# Patient Record
Sex: Female | Born: 1960 | Race: Black or African American | Hispanic: No | Marital: Married | Smoking: Current every day smoker
Health system: Southern US, Community
[De-identification: ages and names within clinical notes are randomized; demographics above are authoritative.]

## PROBLEM LIST (undated history)

## (undated) DIAGNOSIS — I10 Essential (primary) hypertension: Secondary | ICD-10-CM

## (undated) HISTORY — PX: KNEE ARTHROSCOPY: SUR90

---

## 1998-06-27 ENCOUNTER — Ambulatory Visit (HOSPITAL_COMMUNITY): Admission: RE | Admit: 1998-06-27 | Discharge: 1998-06-27 | Payer: Self-pay | Admitting: Obstetrics and Gynecology

## 1998-07-04 ENCOUNTER — Encounter (HOSPITAL_COMMUNITY): Admission: RE | Admit: 1998-07-04 | Discharge: 1998-07-28 | Payer: Self-pay | Admitting: Obstetrics and Gynecology

## 1998-07-27 ENCOUNTER — Inpatient Hospital Stay (HOSPITAL_COMMUNITY): Admission: AD | Admit: 1998-07-27 | Discharge: 1998-07-30 | Payer: Self-pay | Admitting: Obstetrics and Gynecology

## 2004-05-13 ENCOUNTER — Emergency Department (HOSPITAL_COMMUNITY): Admission: EM | Admit: 2004-05-13 | Discharge: 2004-05-13 | Payer: Self-pay | Admitting: Emergency Medicine

## 2004-05-31 ENCOUNTER — Ambulatory Visit (HOSPITAL_COMMUNITY): Admission: RE | Admit: 2004-05-31 | Discharge: 2004-05-31 | Payer: Self-pay | Admitting: Orthopaedic Surgery

## 2004-06-26 ENCOUNTER — Ambulatory Visit (HOSPITAL_COMMUNITY): Admission: RE | Admit: 2004-06-26 | Discharge: 2004-06-26 | Payer: Self-pay | Admitting: Orthopaedic Surgery

## 2004-06-29 ENCOUNTER — Encounter (HOSPITAL_COMMUNITY): Admission: RE | Admit: 2004-06-29 | Discharge: 2004-07-29 | Payer: Self-pay | Admitting: Orthopaedic Surgery

## 2006-04-30 ENCOUNTER — Emergency Department (HOSPITAL_COMMUNITY): Admission: EM | Admit: 2006-04-30 | Discharge: 2006-04-30 | Payer: Self-pay | Admitting: Emergency Medicine

## 2006-05-31 ENCOUNTER — Emergency Department (HOSPITAL_COMMUNITY): Admission: EM | Admit: 2006-05-31 | Discharge: 2006-05-31 | Payer: Self-pay | Admitting: Emergency Medicine

## 2006-07-27 ENCOUNTER — Emergency Department (HOSPITAL_COMMUNITY): Admission: EM | Admit: 2006-07-27 | Discharge: 2006-07-27 | Payer: Self-pay | Admitting: Emergency Medicine

## 2020-03-02 ENCOUNTER — Ambulatory Visit: Payer: Self-pay

## 2021-05-23 ENCOUNTER — Emergency Department (HOSPITAL_COMMUNITY)
Admission: EM | Admit: 2021-05-23 | Discharge: 2021-05-23 | Disposition: A | Discharge status: Home / Self Care | Payer: Self-pay | Attending: Emergency Medicine | Admitting: Emergency Medicine

## 2021-05-23 ENCOUNTER — Encounter (HOSPITAL_COMMUNITY): Payer: Self-pay

## 2021-05-23 ENCOUNTER — Emergency Department (HOSPITAL_COMMUNITY): Payer: Self-pay

## 2021-05-23 ENCOUNTER — Other Ambulatory Visit: Payer: Self-pay

## 2021-05-23 DIAGNOSIS — F17210 Nicotine dependence, cigarettes, uncomplicated: Secondary | ICD-10-CM | POA: Insufficient documentation

## 2021-05-23 DIAGNOSIS — I10 Essential (primary) hypertension: Secondary | ICD-10-CM | POA: Insufficient documentation

## 2021-05-23 DIAGNOSIS — Z79899 Other long term (current) drug therapy: Secondary | ICD-10-CM | POA: Insufficient documentation

## 2021-05-23 DIAGNOSIS — R040 Epistaxis: Secondary | ICD-10-CM | POA: Insufficient documentation

## 2021-05-23 DIAGNOSIS — Z720 Tobacco use: Secondary | ICD-10-CM

## 2021-05-23 DIAGNOSIS — Z7982 Long term (current) use of aspirin: Secondary | ICD-10-CM | POA: Insufficient documentation

## 2021-05-23 HISTORY — DX: Essential (primary) hypertension: I10

## 2021-05-23 LAB — TROPONIN I (HIGH SENSITIVITY)
Troponin I (High Sensitivity): 15 ng/L (ref <18)
Troponin I (High Sensitivity): 21 ng/L — ABNORMAL HIGH (ref <18)

## 2021-05-23 LAB — BASIC METABOLIC PANEL
Anion gap: 9 (ref 5–15)
BUN: 11 mg/dL (ref 8–23)
CO2: 25 mmol/L (ref 22–32)
Calcium: 9.6 mg/dL (ref 8.9–10.3)
Chloride: 102 mmol/L (ref 98–111)
Creatinine, Ser: 0.92 mg/dL (ref 0.44–1.00)
GFR, Estimated: >60 mL/min (ref >60)
Glucose, Bld: 120 mg/dL — ABNORMAL HIGH (ref 70–99)
Potassium: 3.4 mmol/L — ABNORMAL LOW (ref 3.5–5.1)
Sodium: 136 mmol/L (ref 135–145)

## 2021-05-23 LAB — URINALYSIS, ROUTINE W REFLEX MICROSCOPIC
Bilirubin Urine: NEGATIVE
Glucose, UA: NEGATIVE mg/dL
Ketones, ur: NEGATIVE mg/dL
Leukocytes,Ua: NEGATIVE
Nitrite: NEGATIVE
Protein, ur: 30 mg/dL — AB
Specific Gravity, Urine: 1.004 — ABNORMAL LOW (ref 1.005–1.030)
pH: 7 (ref 5.0–8.0)

## 2021-05-23 LAB — CBC
HCT: 40.8 % (ref 36.0–46.0)
Hemoglobin: 13.1 g/dL (ref 12.0–15.0)
MCH: 29.9 pg (ref 26.0–34.0)
MCHC: 32.1 g/dL (ref 30.0–36.0)
MCV: 93.2 fL (ref 80.0–100.0)
Platelets: 452 10*3/uL — ABNORMAL HIGH (ref 150–400)
RBC: 4.38 MIL/uL (ref 3.87–5.11)
RDW: 15 % (ref 11.5–15.5)
WBC: 9.6 10*3/uL (ref 4.0–10.5)
nRBC: 0 % (ref 0.0–0.2)

## 2021-05-23 MED ORDER — LABETALOL HCL 5 MG/ML IV SOLN
20.0000 mg | Freq: Once | INTRAVENOUS | Status: AC
  Administered 2021-05-23: 20 mg via INTRAVENOUS
  Filled 2021-05-23: qty 4

## 2021-05-23 MED ORDER — SILVER NITRATE-POT NITRATE 75-25 % EX MISC
1.0000 "application " | Freq: Once | CUTANEOUS | Status: AC
  Administered 2021-05-23: 1 via TOPICAL
  Filled 2021-05-23: qty 10

## 2021-05-23 MED ORDER — AMLODIPINE BESYLATE 5 MG PO TABS
10.0000 mg | ORAL_TABLET | Freq: Once | ORAL | Status: AC
  Administered 2021-05-23: 10 mg via ORAL
  Filled 2021-05-23: qty 2

## 2021-05-23 MED ORDER — AMLODIPINE BESYLATE 10 MG PO TABS: 10.0000 mg | ORAL_TABLET | Freq: Every day | ORAL | 0 refills | Status: DC

## 2021-05-23 MED ORDER — HYDRALAZINE HCL 20 MG/ML IJ SOLN
20.0000 mg | Freq: Once | INTRAMUSCULAR | Status: AC
  Administered 2021-05-23: 20 mg via INTRAVENOUS

## 2021-05-23 MED ORDER — SALINE SPRAY 0.65 % NA SOLN
1.0000 | NASAL | Status: DC | PRN
  Administered 2021-05-23: 1 via NASAL
  Filled 2021-05-23: qty 44

## 2021-05-23 NOTE — ED Notes (Signed)
Patient ambulatory to lobby without difficulty. Educated on tracking BP at home and close follow up with PCP. Prescription cards and information given. Denies any further needs or questions at this time

## 2021-05-23 NOTE — Discharge Instructions (Signed)
Try to stop smoking. °

## 2021-05-23 NOTE — ED Provider Notes (Signed)
[facility] EMERGENCY DEPARTMENT Provider Note   CSN: [id number] Arrival date & time: 05/23/21  0809     History Chief Complaint  Patient presents with   Hypertension    Whitney Bird is a 60 y.o. female.  Pt presents to the ED today with elevated blood pressure and a nose bleed.  Pt has a hx of htn, but has not seen a doctor or been on any bp meds in several years.  Pt said she woke up this am with her left nostril bleeding.  Her husband checked her bp and it was elevated at home.  Nosebleed has stopped now.  She denies any cp or h/a.  No sob.      Past Medical History:  Diagnosis Date   Hypertension     There are no problems to display for this patient.   History reviewed. No pertinent surgical history.   OB History   No obstetric history on file.     History reviewed. No pertinent family history.  Social History   Tobacco Use   Smoking status: Every Day    Packs/day: 0.50    Pack years: 0.00    Types: Cigarettes   Smokeless tobacco: Never  Vaping Use   Vaping Use: Never used  Substance Use Topics   Alcohol use: Never   Drug use: Never    Home Medications Prior to Admission medications   Medication Sig Start Date End Date Taking? Authorizing Provider  amLODipine (NORVASC) 10 MG tablet Take 1 tablet (10 mg total) by mouth daily. 05/23/21  Yes Jacalyn Lefevre, MD  aspirin EC 81 MG tablet Take 81 mg by mouth daily as needed for mild pain. Swallow whole.   Yes [provider]  aspirin-acetaminophen-caffeine (EXCEDRIN MIGRAINE) [phone] MG tablet Take 1 tablet by mouth every 6 (six) hours as needed for headache.   Yes [provider]    Allergies    Patient has no known allergies.  Review of Systems   Review of Systems  HENT:  Positive for nosebleeds.   All other systems reviewed and are negative.  Physical Exam Updated Vital Signs BP (!) 198/97   Pulse 100   Temp 98 F (36.7 C) (Oral)   Resp 13   Ht 5\' 5"  (1.651 m)    Wt 90.7 kg   SpO2 100%   BMI 33.28 kg/m   Physical Exam Vitals and nursing note reviewed.  Constitutional:      Appearance: Normal appearance.  HENT:     Head: Normocephalic and atraumatic.     Comments: Left nare with dried blood at nasal septum    Right Ear: External ear normal.     Left Ear: External ear normal.     Mouth/Throat:     Mouth: Mucous membranes are moist.     Pharynx: Oropharynx is clear.  Eyes:     Extraocular Movements: Extraocular movements intact.     Conjunctiva/sclera: Conjunctivae normal.     Pupils: Pupils are equal, round, and reactive to light.  Cardiovascular:     Rate and Rhythm: Normal rate and regular rhythm.     Pulses: Normal pulses.     Heart sounds: Normal heart sounds.  Pulmonary:     Effort: Pulmonary effort is normal.     Breath sounds: Normal breath sounds.  Abdominal:     General: Abdomen is flat. Bowel sounds are normal.     Palpations: Abdomen is soft.  Musculoskeletal:  General: Normal range of motion.     Cervical back: Normal range of motion and neck supple.  Skin:    General: Skin is warm.     Capillary Refill: Capillary refill takes less than 2 seconds.  Neurological:     General: No focal deficit present.     Mental Status: She is alert and oriented to person, place, and time.  Psychiatric:        Mood and Affect: Mood normal.        Behavior: Behavior normal.    ED Results / Procedures / Treatments   Labs (all labs ordered are listed, but only abnormal results are displayed) Labs Reviewed  BASIC METABOLIC PANEL - Abnormal; Notable for the following components:      Result Value   Potassium 3.4 (*)    Glucose, Bld 120 (*)    All other components within normal limits  CBC - Abnormal; Notable for the following components:   Platelets 452 (*)    All other components within normal limits  URINALYSIS, ROUTINE W REFLEX MICROSCOPIC - Abnormal; Notable for the following components:   Color, Urine STRAW (*)     APPearance HAZY (*)    Specific Gravity, Urine 1.004 (*)    Hgb urine dipstick MODERATE (*)    Protein, ur 30 (*)    Bacteria, UA RARE (*)    All other components within normal limits  TROPONIN I (HIGH SENSITIVITY)  TROPONIN I (HIGH SENSITIVITY)    EKG EKG Interpretation  Date/Time:  Wednesday May 23 2021 08:46:58 EDT Ventricular Rate:  114 PR Interval:  168 QRS Duration: 85 QT Interval:  347 QTC Calculation: 478 R Axis:   -13 Text Interpretation: Sinus tachycardia Probable LVH with secondary repol abnrm No old tracing to compare Confirmed by Jacalyn Lefevre [phone]) on 05/23/2021 8:50:40 AM  Radiology DG Chest Port 1 View  Result Date: 05/23/2021 CLINICAL DATA:  Hypertension. EXAM: PORTABLE CHEST 1 VIEW COMPARISON:  None. FINDINGS: Cardiomediastinal silhouette is normal. Mediastinal contours appear intact. Tortuosity and calcific atherosclerotic disease of the aorta. There is no evidence of focal airspace consolidation, pleural effusion or pneumothorax. Osseous structures are without acute abnormality. Soft tissues are grossly normal. IMPRESSION: 1. No active disease. 2. Tortuosity and calcific atherosclerotic disease of the aorta. Electronically Signed   By: Ted Mcalpine M.D.   On: 05/23/2021 09:02    Procedures .Epistaxis Management  Date/Time: 05/23/2021 9:49 AM Performed by: Jacalyn Lefevre, MD Authorized by: Jacalyn Lefevre, MD   Consent:    Consent obtained:  Verbal   Consent given by:  Patient   Risks, benefits, and alternatives were discussed: yes     Risks discussed:  Pain   Alternatives discussed:  No treatment Universal protocol:    Procedure explained and questions answered to patient or proxy's satisfaction: yes     Patient identity confirmed:  Verbally with patient Anesthesia:    Anesthesia method:  None Procedure details:    Treatment site:  L anterior   Treatment method:  Silver nitrate   Treatment complexity:  Limited   Treatment episode:  initial   Post-procedure details:    Assessment:  Bleeding stopped   Procedure completion:  Tolerated well, no immediate complications   Medications Ordered in ED Medications  sodium chloride (OCEAN) 0.65 % nasal spray 1 spray (has no administration in time range)  amLODipine (NORVASC) tablet 10 mg (has no administration in time range)  labetalol (NORMODYNE) injection 20 mg (20 mg Intravenous Given 05/23/21 0852)  silver nitrate applicators applicator 1 application (1 application Topical Given 05/23/21 0853)  hydrALAZINE (APRESOLINE) injection 20 mg (20 mg Intravenous Given 05/23/21 0947)    ED Course  I have reviewed the triage vital signs and the nursing notes.  Pertinent labs & imaging results that were available during my care of the patient were reviewed by me and considered in my medical decision making (see chart for details).    MDM Rules/Calculators/A&P                          Nose has not had any bleeding since arrival here.  BP is dropping slowly.  She will be started on Norvasc 10 mg.  SW gave pt a Programmer, systems.  We will try to get pt into a pcp to follow her bp.  She is to try to stop smoking.  She is encouraged to eat a healthier diet and to exercise.  She is to return if worse.  Final Clinical Impression(s) / ED Diagnoses Final diagnoses:  Hypertension, unspecified type  Bleeding nose  Tobacco abuse    Rx / DC Orders ED Discharge Orders          Ordered    amLODipine (NORVASC) 10 MG tablet  Daily        05/23/21 1053             Jacalyn Lefevre, MD 05/23/21 1056

## 2021-05-23 NOTE — TOC Transition Note (Signed)
TOC consulted for medication assistance and PCP needs. CSW spoke with pt in ED abut interest in CSW making referral to the financial counselor, she is agreeable. CSW also provided pt with pharmacy discount cards. CSW verified that [facility] voucher is not needed. Pt also agreeable to [facility] referral as she does not have a PCP. CSW made referral to [facility]. TOC signing off.

## 2021-05-23 NOTE — ED Triage Notes (Signed)
Patient states she woke up this morning with a nosebleed. Husband checked her BP this AM and it read 190s. Was on BP meds but took self off of them.

## 2021-06-19 ENCOUNTER — Ambulatory Visit: Payer: Self-pay | Admitting: Physician Assistant

## 2021-06-19 ENCOUNTER — Encounter: Payer: Self-pay | Admitting: Physician Assistant

## 2021-06-19 ENCOUNTER — Other Ambulatory Visit: Payer: Self-pay

## 2021-06-19 VITALS — BP 181/103 | HR 125 | Temp 96.8°F | Wt 198.0 lb

## 2021-06-19 DIAGNOSIS — E669 Obesity, unspecified: Secondary | ICD-10-CM

## 2021-06-19 DIAGNOSIS — I10 Essential (primary) hypertension: Secondary | ICD-10-CM

## 2021-06-19 DIAGNOSIS — R7989 Other specified abnormal findings of blood chemistry: Secondary | ICD-10-CM

## 2021-06-19 DIAGNOSIS — Z1211 Encounter for screening for malignant neoplasm of colon: Secondary | ICD-10-CM

## 2021-06-19 DIAGNOSIS — R319 Hematuria, unspecified: Secondary | ICD-10-CM

## 2021-06-19 DIAGNOSIS — Z13220 Encounter for screening for lipoid disorders: Secondary | ICD-10-CM

## 2021-06-19 DIAGNOSIS — Z1239 Encounter for other screening for malignant neoplasm of breast: Secondary | ICD-10-CM

## 2021-06-19 DIAGNOSIS — Z131 Encounter for screening for diabetes mellitus: Secondary | ICD-10-CM

## 2021-06-19 DIAGNOSIS — R7309 Other abnormal glucose: Secondary | ICD-10-CM

## 2021-06-19 DIAGNOSIS — Z7689 Persons encountering health services in other specified circumstances: Secondary | ICD-10-CM

## 2021-06-19 DIAGNOSIS — R000 Tachycardia, unspecified: Secondary | ICD-10-CM

## 2021-06-19 MED ORDER — CLONIDINE HCL 0.1 MG PO TABS
0.1000 mg | ORAL_TABLET | Freq: Once | ORAL | Status: AC
  Administered 2021-06-19: 0.1 mg via ORAL

## 2021-06-19 MED ORDER — METOPROLOL TARTRATE 50 MG PO TABS: 50.0000 mg | ORAL_TABLET | Freq: Two times a day (BID) | ORAL | 1 refills | Status: DC

## 2021-06-19 NOTE — Progress Notes (Signed)
BP (!) 219/105   Pulse (!) 146   Temp (!) 96.8 F (36 C)   Wt 198 lb (89.8 kg)   SpO2 99%   BMI 32.95 kg/m    Subjective:    Patient ID: Whitney Bird, female    DOB: [birth year], 60 y.o.   MRN: [id number]  HPI: Whitney Bird is a 60 y.o. female presenting on 06/19/2021 for No chief complaint on file.   HPI  Pt had a negative covid 19 screening questionnaire.   Pt is a 59yoF who presents to establish care.  Pt says she Feels good.  She denies having HA, CP or vision changes.  She does not report any problems with her breathing.   Pt was Seen in ER on 05/23/21 with elevated BP and she was started on amlodipine.  At that time, her EKG showed HR 119.   She says she has been taking the amlodipine daily.    Pt says that a long time ago she was on medication for bp but that it has been years since she was on bp meds until last month when seen in ER.  She has enjoyed good health and has never spent overnight in hospital except with childbirth  She smokes 1/2 ppd  She reports that sometimes she has some pressure in ears but not painful and it comes and goes  She got one dose J&J covid vaccine.  She did not get booster.  She monitors her bp at home.    159/89 today.  Yesterday 172/98.     Relevant past medical, surgical, family and social history reviewed and updated as indicated. Interim medical history since our last visit reviewed. Allergies and medications reviewed and updated.  CURRENT MEDS: Amlodipine 10mg   Review of Systems  Per HPI unless specifically indicated above     Objective:    BP (!) 219/105   Pulse (!) 146   Temp (!) 96.8 F (36 C)   Wt 198 lb (89.8 kg)   SpO2 99%   BMI 32.95 kg/m   Wt Readings from Last 3 Encounters:  06/19/21 198 lb (89.8 kg)  05/23/21 200 lb (90.7 kg)    Physical Exam Vitals reviewed.  Constitutional:      General: She is not in acute distress.    Appearance: She is well-developed. She is obese. She is not  ill-appearing.  HENT:     Head: Normocephalic and atraumatic.     Right Ear: Tympanic membrane, ear canal and external ear normal.     Left Ear: Tympanic membrane, ear canal and external ear normal.  Eyes:     Extraocular Movements: Extraocular movements intact.     Conjunctiva/sclera: Conjunctivae normal.     Pupils: Pupils are equal, round, and reactive to light.  Neck:     Thyroid: No thyromegaly.  Cardiovascular:     Rate and Rhythm: Regular rhythm. Tachycardia present.     Heart sounds: No murmur heard. Pulmonary:     Effort: Pulmonary effort is normal.     Breath sounds: Normal breath sounds.  Abdominal:     General: Bowel sounds are normal.     Palpations: Abdomen is soft. There is no mass.     Tenderness: There is no abdominal tenderness.  Musculoskeletal:     Cervical back: Neck supple.     Right lower leg: No edema.     Left lower leg: No edema.  Lymphadenopathy:     Cervical: No cervical adenopathy.  Skin:    General: Skin is warm and dry.  Neurological:     Mental Status: She is alert and oriented to person, place, and time.     Motor: Tremor present. No weakness.     Gait: Gait normal.     Comments: Mild tremor of hand bilaterally  Psychiatric:        Attention and Perception: Attention normal.        Mood and Affect: Mood normal.        Speech: Speech normal.        Behavior: Behavior normal. Behavior is cooperative.     Comments: Pleasant.  Engages readily.       EKG- sinus tachycardia at 128bpm.  No changes other than rate compared with EKG done on 05/23/21 in the ER.       Assessment & Plan:     Encounter Diagnoses  Name Primary?   Encounter to establish care Yes   Primary hypertension    Sinus tachycardia    Elevated platelet count    Elevated glucose level    Hematuria, unspecified type    Obesity, unspecified classification, unspecified obesity type, unspecified whether serious comorbidity present    Screening cholesterol level     Screening for diabetes mellitus    Screening for colon cancer    Encounter for screening for malignant neoplasm of breast, unspecified screening modality      In office today: 1:35pm- bp 216/117 pulse 146 Pt was given clonidine 0.1mg  PO  2:01pm  -   bp 197/103  pulse 133 2:03pm- 0.1mg  clonidine given PO  2:24pm  181/103  125     -pt will be referred for screening Mammogram  -pt is given FIT test for colon cancer screening  -pt is to Continue amlodipine add metoprolol.  Pt is urged to pick up her metoprolol today and get started on it to help her fast pulse and her elevated bp.  She is to monitor her bp at home and notify office if it continues to run high (like the 170s or higher).  She is encouraged to avoid heavy exertion or emotional things like arguments until her bp is better controlled.  Pt states understanding  -pt to get fasting Labs drawn tomorrow morning  -pt will follow up in 2 weeks.  She is to contact office sooner if she starts feeling poorly

## 2021-06-20 ENCOUNTER — Telehealth: Payer: Self-pay

## 2021-06-20 NOTE — Telephone Encounter (Signed)
Called client to follow up after her [facility] appointment , client reports it went well and states she was placed on another medication in addition to amlodipine which was Metoprolol. She as able to pick up that medication and has started it. She reports she should have transportation for her follow up but if she does not she will call this RN for transportation assistance.  Will follow as needed.  Plan: follow and discuss with provider as potential remote monitoring client for hypertension program.  Francee Nodal RN [facility].

## 2021-06-27 ENCOUNTER — Other Ambulatory Visit: Payer: Self-pay | Admitting: Physician Assistant

## 2021-06-27 DIAGNOSIS — Z1239 Encounter for other screening for malignant neoplasm of breast: Secondary | ICD-10-CM

## 2021-07-03 ENCOUNTER — Ambulatory Visit: Payer: Self-pay | Admitting: Physician Assistant

## 2021-07-03 ENCOUNTER — Other Ambulatory Visit: Payer: Self-pay | Admitting: Physician Assistant

## 2021-07-03 ENCOUNTER — Encounter: Payer: Self-pay | Admitting: Physician Assistant

## 2021-07-03 VITALS — BP 117/76 | HR 72 | Temp 98.0°F | Wt 193.0 lb

## 2021-07-03 DIAGNOSIS — Z1211 Encounter for screening for malignant neoplasm of colon: Secondary | ICD-10-CM

## 2021-07-03 DIAGNOSIS — E669 Obesity, unspecified: Secondary | ICD-10-CM

## 2021-07-03 DIAGNOSIS — I10 Essential (primary) hypertension: Secondary | ICD-10-CM

## 2021-07-03 LAB — IFOBT (OCCULT BLOOD): IFOBT: NEGATIVE

## 2021-07-03 NOTE — Progress Notes (Signed)
BP 117/76   Pulse 72   Temp 98 F (36.7 C)   Wt 193 lb (87.5 kg)   SpO2 99%   BMI 32.12 kg/m    Subjective:    Patient ID: Whitney Bird, female    DOB: Mar 23, 1961, 60 y.o.   MRN: [id number]  HPI: Whitney Bird is a 60 y.o. female presenting on 07/03/2021 for Hypertension   HPI  Pt had a negative covid 19 screening questionnaire.  Chief Complaint  Patient presents with   Hypertension    Pt was seen as a new patient in this office earlier this month and had an elevated bp.  Pt says her bp has been doing good and she is feeling good.  She did not geet her labs drawn; she says she has transportation problems.    Relevant past medical, surgical, family and social history reviewed and updated as indicated. Interim medical history since our last visit reviewed. Allergies and medications reviewed and updated.   Current Outpatient Medications:    amLODipine (NORVASC) 10 MG tablet, Take 1 tablet (10 mg total) by mouth daily., Disp: 90 tablet, Rfl: 0   metoprolol tartrate (LOPRESSOR) 50 MG tablet, Take 1 tablet (50 mg total) by mouth 2 (two) times daily., Disp: 60 tablet, Rfl: 1   aspirin EC 81 MG tablet, Take 81 mg by mouth daily as needed for mild pain. Swallow whole. (Patient not taking: No sig reported), Disp: , Rfl:    aspirin-acetaminophen-caffeine (EXCEDRIN MIGRAINE) 250-250-65 MG tablet, Take 1 tablet by mouth every 6 (six) hours as needed for headache. (Patient not taking: No sig reported), Disp: , Rfl:     Review of Systems  Per HPI unless specifically indicated above     Objective:    BP 117/76   Pulse 72   Temp 98 F (36.7 C)   Wt 193 lb (87.5 kg)   SpO2 99%   BMI 32.12 kg/m   Wt Readings from Last 3 Encounters:  07/03/21 193 lb (87.5 kg)  06/19/21 198 lb (89.8 kg)  05/23/21 200 lb (90.7 kg)    Physical Exam Vitals reviewed.  Constitutional:      General: She is not in acute distress.    Appearance: She is well-developed. She is not  ill-appearing.  HENT:     Head: Normocephalic and atraumatic.  Cardiovascular:     Rate and Rhythm: Normal rate and regular rhythm.  Pulmonary:     Effort: Pulmonary effort is normal.     Breath sounds: Normal breath sounds.  Abdominal:     General: Bowel sounds are normal.     Palpations: Abdomen is soft. There is no mass.     Tenderness: There is no abdominal tenderness.  Musculoskeletal:     Cervical back: Neck supple.     Right lower leg: No edema.     Left lower leg: No edema.  Lymphadenopathy:     Cervical: No cervical adenopathy.  Skin:    General: Skin is warm and dry.  Neurological:     Mental Status: She is alert and oriented to person, place, and time.  Psychiatric:        Behavior: Behavior normal.         Assessment & Plan:    Encounter Diagnoses  Name Primary?   Primary hypertension Yes   Obesity, unspecified classification, unspecified obesity type, unspecified whether serious comorbidity present    Screening for colon cancer      -pt is encouraged  to contact [facility] if help is needed with transportation.  Encouraged her to consider making arrangements since she has a mammogram appointment next week.  She is encouraged to get her labs drawn as soon as possible.  She will be called with results.  She is to continue current medications for the BP.  She is to follow up in the office in 3 months.  She should call the office sooner if needed

## 2021-07-04 ENCOUNTER — Telehealth: Payer: Self-pay

## 2021-07-04 NOTE — Telephone Encounter (Signed)
Called to follow up with client regarding her follow up [facility] appointment. Client's blood pressure is much improved. Client reports she is feeling better, she does report some occasional dizziness after taking her medication, but improves as the day goes by. Recommended that client go from lying, sitting to standing slowly to give her body time to acclimate. Client reports understanding.  Reviewed that client has an upcoming mammogram at [facility] 07/09/21 and that she has labs to have drawn. Client reports she plans on having transportation but will call this RN by Friday this week. Discussed also any food insecurity and discussed that [employer] has a food market that she can access once per month. Client reports she will come to see if there is some produce for she and her husband.  Francee Nodal RN [facility] [employer]

## 2021-07-07 ENCOUNTER — Other Ambulatory Visit: Payer: Self-pay

## 2021-07-07 DIAGNOSIS — Z79899 Other long term (current) drug therapy: Secondary | ICD-10-CM | POA: Insufficient documentation

## 2021-07-07 DIAGNOSIS — I10 Essential (primary) hypertension: Secondary | ICD-10-CM | POA: Insufficient documentation

## 2021-07-07 DIAGNOSIS — F17210 Nicotine dependence, cigarettes, uncomplicated: Secondary | ICD-10-CM | POA: Insufficient documentation

## 2021-07-07 DIAGNOSIS — Z7982 Long term (current) use of aspirin: Secondary | ICD-10-CM | POA: Insufficient documentation

## 2021-07-08 ENCOUNTER — Emergency Department (HOSPITAL_COMMUNITY)
Admission: EM | Admit: 2021-07-08 | Discharge: 2021-07-08 | Disposition: A | Discharge status: Home / Self Care | Payer: Self-pay | Attending: Emergency Medicine | Admitting: Emergency Medicine

## 2021-07-08 ENCOUNTER — Encounter (HOSPITAL_COMMUNITY): Payer: Self-pay | Admitting: Emergency Medicine

## 2021-07-08 ENCOUNTER — Other Ambulatory Visit: Payer: Self-pay

## 2021-07-08 DIAGNOSIS — R000 Tachycardia, unspecified: Secondary | ICD-10-CM

## 2021-07-08 DIAGNOSIS — I10 Essential (primary) hypertension: Secondary | ICD-10-CM

## 2021-07-08 LAB — CBC WITH DIFFERENTIAL/PLATELET
Abs Immature Granulocytes: 0.02 10*3/uL (ref 0.00–0.07)
Basophils Absolute: 0.1 10*3/uL (ref 0.0–0.1)
Basophils Relative: 0 %
Eosinophils Absolute: 0 10*3/uL (ref 0.0–0.5)
Eosinophils Relative: 0 %
HCT: 42.6 % (ref 36.0–46.0)
Hemoglobin: 13.5 g/dL (ref 12.0–15.0)
Immature Granulocytes: 0 %
Lymphocytes Relative: 27 %
Lymphs Abs: 3.1 10*3/uL (ref 0.7–4.0)
MCH: 28.7 pg (ref 26.0–34.0)
MCHC: 31.7 g/dL (ref 30.0–36.0)
MCV: 90.6 fL (ref 80.0–100.0)
Monocytes Absolute: 0.6 10*3/uL (ref 0.1–1.0)
Monocytes Relative: 5 %
Neutro Abs: 8 10*3/uL — ABNORMAL HIGH (ref 1.7–7.7)
Neutrophils Relative %: 68 %
Platelets: 552 10*3/uL — ABNORMAL HIGH (ref 150–400)
RBC: 4.7 MIL/uL (ref 3.87–5.11)
RDW: 13.9 % (ref 11.5–15.5)
WBC: 11.8 10*3/uL — ABNORMAL HIGH (ref 4.0–10.5)
nRBC: 0 % (ref 0.0–0.2)

## 2021-07-08 LAB — BASIC METABOLIC PANEL
Anion gap: 9 (ref 5–15)
BUN: 14 mg/dL (ref 8–23)
CO2: 23 mmol/L (ref 22–32)
Calcium: 10 mg/dL (ref 8.9–10.3)
Chloride: 104 mmol/L (ref 98–111)
Creatinine, Ser: 0.99 mg/dL (ref 0.44–1.00)
GFR, Estimated: >60 mL/min (ref >60)
Glucose, Bld: 155 mg/dL — ABNORMAL HIGH (ref 70–99)
Potassium: 3.7 mmol/L (ref 3.5–5.1)
Sodium: 136 mmol/L (ref 135–145)

## 2021-07-08 MED ORDER — METOPROLOL TARTRATE 5 MG/5ML IV SOLN
5.0000 mg | Freq: Once | INTRAVENOUS | Status: AC
  Administered 2021-07-08: 5 mg via INTRAVENOUS
  Filled 2021-07-08: qty 5

## 2021-07-08 NOTE — ED Provider Notes (Signed)
[facility] EMERGENCY DEPARTMENT Provider Note   CSN: [id number] Arrival date & time: 07/07/21  2247     History Chief Complaint  Patient presents with   Hypertension    Whitney Bird is a 60 y.o. female.  The history is provided by the patient.  Hypertension She has history of hypertension and has been checking her blood pressure at home as a routine.  Tonight, her blood pressure was 185/105 with a heart rate of 120.  She was aware of a fast heartbeat but had no symptoms related to her hypertension.  She denies any headache, tinnitus, nosebleeds.  She denies chest pain, shortness of breath, nausea, vomiting.  She denies missing any doses of her medications.  She is on metoprolol as well as amlodipine.  She states that recently her blood pressure has been very well controlled.   Past Medical History:  Diagnosis Date   Hypertension     There are no problems to display for this patient.   Past Surgical History:  Procedure Laterality Date   KNEE ARTHROSCOPY Right      OB History   No obstetric history on file.     No family history on file.  Social History   Tobacco Use   Smoking status: Every Day    Packs/day: 0.50    Types: Cigarettes   Smokeless tobacco: Never  Vaping Use   Vaping Use: Never used  Substance Use Topics   Alcohol use: Never   Drug use: Not Currently    Types: Marijuana    Comment: none since 60yo    Home Medications Prior to Admission medications   Medication Sig Start Date End Date Taking? Authorizing Provider  amLODipine (NORVASC) 10 MG tablet Take 1 tablet (10 mg total) by mouth daily. 05/23/21   Jacalyn Lefevre, MD  aspirin EC 81 MG tablet Take 81 mg by mouth daily as needed for mild pain. Swallow whole. Patient not taking: No sig reported    [provider]  aspirin-acetaminophen-caffeine (EXCEDRIN MIGRAINE) [id number] MG tablet Take 1 tablet by mouth every 6 (six) hours as needed for headache. Patient not taking: No  sig reported    [provider]  metoprolol tartrate (LOPRESSOR) 50 MG tablet Take 1 tablet (50 mg total) by mouth 2 (two) times daily. 06/19/21   Jacquelin Hawking, PA-C    Allergies    Patient has no known allergies.  Review of Systems   Review of Systems  All other systems reviewed and are negative.  Physical Exam Updated Vital Signs BP (!) 192/87   Pulse (!) 115   Temp 98.1 F (36.7 C) (Oral)   Resp 16   Ht 5\' 5"  (1.651 m)   Wt 87.5 kg   SpO2 97%   BMI 32.12 kg/m   Physical Exam Vitals and nursing note reviewed.  60 year old female, resting comfortably and in no acute distress. Vital signs are significant for elevated heart rate and blood pressure. Oxygen saturation is 97%, which is normal. Head is normocephalic and atraumatic. PERRLA, EOMI. Oropharynx is clear. Neck is nontender and supple without adenopathy or JVD. Back is nontender and there is no CVA tenderness. Lungs are clear without rales, wheezes, or rhonchi. Chest is nontender. Heart is tachycardic without murmur. Abdomen is soft, flat, nontender without masses or hepatosplenomegaly and peristalsis is normoactive. Extremities have no cyanosis or edema, full range of motion is present. Skin is warm and dry without rash. Neurologic: Mental status is normal, cranial nerves are  intact, moves all extremities equally.  ED Results / Procedures / Treatments   Labs (all labs ordered are listed, but only abnormal results are displayed) Labs Reviewed  BASIC METABOLIC PANEL - Abnormal; Notable for the following components:      Result Value   Glucose, Bld 155 (*)    All other components within normal limits  CBC WITH DIFFERENTIAL/PLATELET - Abnormal; Notable for the following components:   WBC 11.8 (*)    Platelets 552 (*)    Neutro Abs 8.0 (*)    All other components within normal limits    EKG EKG Interpretation  Date/Time:  Sunday July 08 2021 00:35:41 EDT Ventricular Rate:  152 PR  Interval:  151 QRS Duration: 87 QT Interval:  267 QTC Calculation: 425 R Axis:   -15 Text Interpretation: Sinus tachycardia Probable left atrial enlargement Borderline left axis deviation Low voltage, precordial leads Baseline wander in lead(s) II When compared with ECG of 05/23/2021, HEART RATE has increased Confirmed by Dione Booze ([id number]) on 07/08/2021 12:44:53 AM  Procedures Procedures   Medications Ordered in ED Medications  metoprolol tartrate (LOPRESSOR) injection 5 mg (5 mg Intravenous Given 07/08/21 0120)    ED Course  I have reviewed the triage vital signs and the nursing notes.  Pertinent labresults that were available during my care of the patient were reviewed by me and considered in my medical decision making (see chart for details).   MDM Rules/Calculators/A&P                         Elevated blood pressure and patient with known history of hypertension.  Elevated heart rate.  It is unusual that her heart rate would be this high while on beta-blocker.  She has not taken her evening dose.  ECG shows sinus tachycardia and no other significant changes.  Will check screening labs and give additional metoprolol.  Labs are reassuring.  Blood pressure has come down with metoprolol, but not back to normal.  Heart rate has come down.  She is felt to be safe for discharge.  She is advised to continue monitoring her blood pressure at home and continue working with her primary care provider to optimize her medications for blood pressure control.  Final Clinical Impression(s) / ED Diagnoses Final diagnoses:  Elevated blood pressure reading with diagnosis of hypertension  Sinus tachycardia    Rx / DC Orders ED Discharge Orders     None        Dione Booze, MD 07/08/21 [phone]

## 2021-07-08 NOTE — ED Triage Notes (Signed)
Pt reports HTN and high HR since 2030; reports she takes BP meds and has not missed any doses; reports hx of panic attacks

## 2021-07-08 NOTE — Discharge Instructions (Addendum)
Please take your evening dose of metoprolol when you get home.  Continue to monitor your blood pressure.  It is probably sufficient for you to take your blood pressure once or twice a day.  Keep a record of it and take that record with you when you see your primary care provider.  Return if you have any new or concerning symptoms.

## 2021-07-09 ENCOUNTER — Ambulatory Visit (HOSPITAL_COMMUNITY)
Admission: RE | Admit: 2021-07-09 | Discharge: 2021-07-09 | Disposition: A | Discharge status: Home / Self Care | Payer: Self-pay | Source: Ambulatory Visit | Attending: Physician Assistant | Admitting: Physician Assistant

## 2021-07-09 DIAGNOSIS — Z1239 Encounter for other screening for malignant neoplasm of breast: Secondary | ICD-10-CM

## 2021-07-16 ENCOUNTER — Other Ambulatory Visit (HOSPITAL_COMMUNITY): Payer: Self-pay | Admitting: Physician Assistant

## 2021-07-16 DIAGNOSIS — R928 Other abnormal and inconclusive findings on diagnostic imaging of breast: Secondary | ICD-10-CM

## 2021-07-17 ENCOUNTER — Other Ambulatory Visit: Payer: Self-pay

## 2021-07-17 ENCOUNTER — Ambulatory Visit (HOSPITAL_COMMUNITY)
Admission: RE | Admit: 2021-07-17 | Discharge: 2021-07-17 | Disposition: A | Discharge status: Home / Self Care | Payer: Self-pay | Source: Ambulatory Visit | Attending: Physician Assistant | Admitting: Physician Assistant

## 2021-07-17 DIAGNOSIS — R928 Other abnormal and inconclusive findings on diagnostic imaging of breast: Secondary | ICD-10-CM

## 2021-07-18 ENCOUNTER — Other Ambulatory Visit: Payer: Self-pay | Admitting: Physician Assistant

## 2021-07-20 ENCOUNTER — Other Ambulatory Visit: Payer: Self-pay | Admitting: Physician Assistant

## 2021-07-20 DIAGNOSIS — R928 Other abnormal and inconclusive findings on diagnostic imaging of breast: Secondary | ICD-10-CM

## 2021-07-23 ENCOUNTER — Other Ambulatory Visit: Payer: Self-pay | Admitting: Physician Assistant

## 2021-07-23 DIAGNOSIS — R928 Other abnormal and inconclusive findings on diagnostic imaging of breast: Secondary | ICD-10-CM

## 2021-07-31 ENCOUNTER — Other Ambulatory Visit: Payer: Self-pay

## 2021-07-31 ENCOUNTER — Ambulatory Visit: Payer: Self-pay | Admitting: *Deleted

## 2021-07-31 VITALS — BP 178/116 | Wt 189.3 lb

## 2021-07-31 DIAGNOSIS — Z1239 Encounter for other screening for malignant neoplasm of breast: Secondary | ICD-10-CM

## 2021-07-31 NOTE — Patient Instructions (Addendum)
Explained breast self awareness with Whitney Bird. Patient refused Pap smear today. Explained the importance of Pap smears. Patient stated she would call to schedule Pap smear. Let her know [facility] will cover Pap smears every 3 years unless has a history of abnormal Pap smears. Referred patient to the [facility] for a left breast biopsy per recommendation. Appointment scheduled Thursday, August 02, 2021 at 0930. Patient aware of appointment and will be there. Discussed smoking cessation with patient. Referred to the [facility] Quitline and gave resources to the free smoking cessation classes at [facility]. Whitney Bird verbalized understanding.  Baila Rouse, Kathaleen Maser, RN 2:52 PM

## 2021-07-31 NOTE — Progress Notes (Addendum)
Ms. Whitney Bird is a 60 y.o. female who presents to [facility] clinic today with no complaints. Patient referred to [facility] by the [facility] due to having a screening mammogram completed 07/11/2021 that additional imaging of the left breast recommended. Left breast diagnostic mammogram completed 07/17/2021 that recommended a stereotactic needle core biopsy.   Pap Smear: Pap smear not completed today. Last Pap smear was 5 years ago at [facility] clinic and was normal per patient. Per patient has no history of an abnormal Pap smear. Last Pap smear result is not available in Epic.   Physical exam: Breasts Breasts symmetrical. No skin abnormalities bilateral breasts. No nipple retraction bilateral breasts. No nipple discharge bilateral breasts. No lymphadenopathy. No lumps palpated bilateral breasts. No complaints of pain or tenderness on exam.  MS DIGITAL SCREENING TOMO BILATERAL  Result Date: 07/11/2021 CLINICAL DATA:  Screening. EXAM: DIGITAL SCREENING BILATERAL MAMMOGRAM WITH TOMOSYNTHESIS AND CAD TECHNIQUE: Bilateral screening digital craniocaudal and mediolateral oblique mammograms were obtained. Bilateral screening digital breast tomosynthesis was performed. The images were evaluated with computer-aided detection. COMPARISON:  None; baseline examination. ACR Breast Density Category b: There are scattered areas of fibroglandular density. FINDINGS: In the right breast 2 groups of calcifications at the 10 o'clock position posterior depth and 11 o'clock position middle depth require further evaluation. In the left breast an asymmetry in upper breast (MLO tomosynthesis frame 30/69 and 37/72) requires further evaluation. There is a possible correlate in the central left breast at middle depth on the second CC image (tomosynthesis frame 27/57). IMPRESSION: Further evaluation is suggested for 2 groups of calcifications in the upper outer right breast. Further evaluation  is suggested for possible asymmetry in the upper left breast. RECOMMENDATION: Diagnostic mammogram and possibly ultrasound of both breasts. (Code:[id number]) The patient will be contacted regarding the findings, and additional imaging will be scheduled. BI-RADS CATEGORY  0: Incomplete. Need additional imaging evaluation and/or prior mammograms for comparison. Electronically Signed   By: Sherron Ales MD   On: 07/11/2021 16:33   MS DIGITAL DIAG TOMO BILAT  Result Date: 07/17/2021 CLINICAL DATA:  Screening recall for right breast calcifications and a possible left breast asymmetry. Patient screening study was her baseline exam. EXAM: DIGITAL DIAGNOSTIC BILATERAL MAMMOGRAM WITH TOMOSYNTHESIS AND CAD TECHNIQUE: Bilateral digital diagnostic mammography and breast tomosynthesis was performed. The images were evaluated with computer-aided detection. COMPARISON:  Screening exam dated 07/09/2021. ACR Breast Density Category b: There are scattered areas of fibroglandular density. FINDINGS: In the right breast, the 2 groups of calcifications are better defined on spot magnification imaging. The more anterior group, lying at middle depth, projecting near 11 o'clock, spans 1.1 cm in long axis, comprised of coarse heterogeneous calcifications, with no associated mass or distortion and no linearity or branching. The more posterior group, projecting near 10 o'clock, spans approximately 0.9 cm. The calcifications appear linearly arranged on the lateral view, but are more loosely grouped on the cc view. There is no associated mass or distortion. In the left breast, the possible asymmetry disperses with spot compression imaging. There is no underlying mass or significant residual asymmetry. There are no areas of architectural distortion or suspicious calcifications. IMPRESSION: 1. Two indeterminate groups of calcifications in the right breast, the slightly larger and more numerous group of calcifications lies at 10 o'clock, middle  depth, with the other group lying at 11 o'clock, posterior depth. Tissue sampling is recommended for both groups. 2. No evidence of left breast malignancy. RECOMMENDATION: 1. Stereotactic  core needle biopsy of 2 groups of right breast calcifications. The more posterior group may not be amenable to stereotactic biopsy due to its posterior location. If the posterior group cannot be biopsied, and if the group at 10 o'clock, middle depth is benign, then short-term follow-up for the more posterior group would be recommended. I have discussed the findings and recommendations with the patient. If applicable, a reminder letter will be sent to the patient regarding the next appointment. BI-RADS CATEGORY  4: Suspicious. Electronically Signed   By: Amie Portland M.D.   On: 07/17/2021 15:12        Pelvic/Bimanual Patient refused Pap smear today. Explained the importance of Pap smears. Patient stated she would call to schedule Pap smear.   Smoking History: Patient is a current smoker. Discussed smoking cessation with patient. Referred to the [facility] Quitline and gave resources to the free smoking cessation classes at [facility].   Patient Navigation: Patient education provided. Access to services provided for patient through [facility] program. Transportation provided to [employer] appointment and home today. Transportation scheduled for patient to appointment at the [facility] and home.   Colorectal Cancer Screening: Per patient has had colonoscopy completed on July 2022.  No complaints today.    Breast and Cervical Cancer Risk Assessment: Patient does not have family history of breast cancer, known genetic mutations, or radiation treatment to the chest before age 98. Patient does not have history of cervical dysplasia, immunocompromised, or DES exposure in-utero.  Risk Assessment     Risk Scores       07/31/2021   Last edited by: Meryl Dare, CMA   5-year risk: 1.5 %   Lifetime risk: 6.7 %             A: BCCCP exam without pap smear No complaints.  P: Referred patient to the [facility] for a left breast biopsy per recommendation. Appointment scheduled Thursday, August 02, 2021 at 0930.  Priscille Heidelberg, RN 07/31/2021 2:52 PM

## 2021-08-02 ENCOUNTER — Other Ambulatory Visit: Payer: Self-pay

## 2021-08-02 ENCOUNTER — Ambulatory Visit
Admission: RE | Admit: 2021-08-02 | Discharge: 2021-08-02 | Disposition: A | Discharge status: Home / Self Care | Payer: No Typology Code available for payment source | Source: Ambulatory Visit | Attending: Physician Assistant | Admitting: Physician Assistant

## 2021-08-02 DIAGNOSIS — R928 Other abnormal and inconclusive findings on diagnostic imaging of breast: Secondary | ICD-10-CM

## 2021-08-03 HISTORY — PX: BREAST BIOPSY: SHX20

## 2021-08-14 ENCOUNTER — Other Ambulatory Visit: Payer: Self-pay | Admitting: Physician Assistant

## 2021-08-14 MED ORDER — METOPROLOL TARTRATE 50 MG PO TABS: 50.0000 mg | ORAL_TABLET | Freq: Two times a day (BID) | ORAL | 1 refills | Status: DC

## 2021-08-14 MED ORDER — AMLODIPINE BESYLATE 10 MG PO TABS: 10.0000 mg | ORAL_TABLET | Freq: Every day | ORAL | 1 refills | Status: DC

## 2021-08-15 ENCOUNTER — Other Ambulatory Visit: Payer: Self-pay | Admitting: Physician Assistant

## 2021-09-05 ENCOUNTER — Other Ambulatory Visit: Payer: Self-pay

## 2021-09-05 ENCOUNTER — Other Ambulatory Visit (HOSPITAL_COMMUNITY)
Admission: RE | Admit: 2021-09-05 | Discharge: 2021-09-05 | Disposition: A | Discharge status: Home / Self Care | Payer: No Typology Code available for payment source | Source: Ambulatory Visit | Attending: Physician Assistant | Admitting: Physician Assistant

## 2021-09-05 DIAGNOSIS — R7309 Other abnormal glucose: Secondary | ICD-10-CM | POA: Insufficient documentation

## 2021-09-05 DIAGNOSIS — Z13220 Encounter for screening for lipoid disorders: Secondary | ICD-10-CM | POA: Insufficient documentation

## 2021-09-05 DIAGNOSIS — Z131 Encounter for screening for diabetes mellitus: Secondary | ICD-10-CM | POA: Insufficient documentation

## 2021-09-05 DIAGNOSIS — R7989 Other specified abnormal findings of blood chemistry: Secondary | ICD-10-CM | POA: Insufficient documentation

## 2021-09-05 DIAGNOSIS — R000 Tachycardia, unspecified: Secondary | ICD-10-CM | POA: Insufficient documentation

## 2021-09-05 DIAGNOSIS — I10 Essential (primary) hypertension: Secondary | ICD-10-CM | POA: Insufficient documentation

## 2021-09-05 DIAGNOSIS — R319 Hematuria, unspecified: Secondary | ICD-10-CM | POA: Insufficient documentation

## 2021-09-05 LAB — CBC WITH DIFFERENTIAL/PLATELET
Abs Immature Granulocytes: 0.01 10*3/uL (ref 0.00–0.07)
Basophils Absolute: 0 10*3/uL (ref 0.0–0.1)
Basophils Relative: 0 %
Eosinophils Absolute: 0.1 10*3/uL (ref 0.0–0.5)
Eosinophils Relative: 1 %
HCT: 40 % (ref 36.0–46.0)
Hemoglobin: 12.8 g/dL (ref 12.0–15.0)
Immature Granulocytes: 0 %
Lymphocytes Relative: 44 %
Lymphs Abs: 3.8 10*3/uL (ref 0.7–4.0)
MCH: 29.4 pg (ref 26.0–34.0)
MCHC: 32 g/dL (ref 30.0–36.0)
MCV: 91.7 fL (ref 80.0–100.0)
Monocytes Absolute: 0.5 10*3/uL (ref 0.1–1.0)
Monocytes Relative: 6 %
Neutro Abs: 4.1 10*3/uL (ref 1.7–7.7)
Neutrophils Relative %: 49 %
Platelets: 441 10*3/uL — ABNORMAL HIGH (ref 150–400)
RBC: 4.36 MIL/uL (ref 3.87–5.11)
RDW: 14.3 % (ref 11.5–15.5)
WBC: 8.5 10*3/uL (ref 4.0–10.5)
nRBC: 0 % (ref 0.0–0.2)

## 2021-09-05 LAB — HEMOGLOBIN A1C
Hgb A1c MFr Bld: 5.4 % (ref 4.8–5.6)
Mean Plasma Glucose: 108.28 mg/dL

## 2021-09-05 LAB — LIPID PANEL
Cholesterol: 248 mg/dL — ABNORMAL HIGH (ref 0–200)
HDL: 45 mg/dL (ref >40)
LDL Cholesterol: 188 mg/dL — ABNORMAL HIGH (ref 0–99)
Total CHOL/HDL Ratio: 5.5 RATIO
Triglycerides: 76 mg/dL (ref <150)
VLDL: 15 mg/dL (ref 0–40)

## 2021-09-05 LAB — URINALYSIS, ROUTINE W REFLEX MICROSCOPIC
Bilirubin Urine: NEGATIVE
Glucose, UA: NEGATIVE mg/dL
Ketones, ur: NEGATIVE mg/dL
Leukocytes,Ua: NEGATIVE
Nitrite: NEGATIVE
Protein, ur: NEGATIVE mg/dL
Specific Gravity, Urine: 1.003 — ABNORMAL LOW (ref 1.005–1.030)
pH: 6 (ref 5.0–8.0)

## 2021-09-05 LAB — COMPREHENSIVE METABOLIC PANEL
ALT: 7 U/L (ref 0–44)
AST: 12 U/L — ABNORMAL LOW (ref 15–41)
Albumin: 4.3 g/dL (ref 3.5–5.0)
Alkaline Phosphatase: 59 U/L (ref 38–126)
Anion gap: 8 (ref 5–15)
BUN: 18 mg/dL (ref 8–23)
CO2: 25 mmol/L (ref 22–32)
Calcium: 9.6 mg/dL (ref 8.9–10.3)
Chloride: 102 mmol/L (ref 98–111)
Creatinine, Ser: 1.05 mg/dL — ABNORMAL HIGH (ref 0.44–1.00)
GFR, Estimated: >60 mL/min (ref >60)
Glucose, Bld: 110 mg/dL — ABNORMAL HIGH (ref 70–99)
Potassium: 4 mmol/L (ref 3.5–5.1)
Sodium: 135 mmol/L (ref 135–145)
Total Bilirubin: 0.5 mg/dL (ref 0.3–1.2)
Total Protein: 8.4 g/dL — ABNORMAL HIGH (ref 6.5–8.1)

## 2021-09-05 LAB — TSH: TSH: 1.09 u[IU]/mL (ref 0.350–4.500)

## 2021-09-05 LAB — BRAIN NATRIURETIC PEPTIDE: B Natriuretic Peptide: 38 pg/mL (ref 0.0–100.0)

## 2021-09-06 ENCOUNTER — Ambulatory Visit: Payer: Self-pay | Admitting: Physician Assistant

## 2021-09-06 ENCOUNTER — Encounter: Payer: Self-pay | Admitting: Physician Assistant

## 2021-09-06 VITALS — Temp 97.8°F | Wt 187.0 lb

## 2021-09-06 DIAGNOSIS — I10 Essential (primary) hypertension: Secondary | ICD-10-CM

## 2021-09-06 DIAGNOSIS — E785 Hyperlipidemia, unspecified: Secondary | ICD-10-CM

## 2021-09-06 DIAGNOSIS — R42 Dizziness and giddiness: Secondary | ICD-10-CM

## 2021-09-06 DIAGNOSIS — R319 Hematuria, unspecified: Secondary | ICD-10-CM

## 2021-09-06 MED ORDER — LOSARTAN POTASSIUM 50 MG PO TABS
50.0000 mg | ORAL_TABLET | Freq: Every day | ORAL | 0 refills | Status: DC
Start: 2021-09-06 — End: 2021-12-06

## 2021-09-06 MED ORDER — ATORVASTATIN CALCIUM 20 MG PO TABS: 20.0000 mg | ORAL_TABLET | Freq: Every day | ORAL | 1 refills | Status: DC

## 2021-09-06 MED ORDER — AMLODIPINE BESYLATE 10 MG PO TABS: 10.0000 mg | ORAL_TABLET | Freq: Every day | ORAL | 1 refills | Status: DC

## 2021-09-06 MED ORDER — METOPROLOL TARTRATE 50 MG PO TABS: ORAL_TABLET | ORAL | 0 refills | Status: DC

## 2021-09-06 NOTE — Patient Instructions (Signed)

## 2021-09-06 NOTE — Progress Notes (Signed)
Temp 97.8 F (36.6 C)   Wt 187 lb (84.8 kg)   SpO2 99%   BMI 31.12 kg/m    Subjective:    Patient ID: Whitney Bird, female    DOB: July 25, 1961, 60 y.o.   MRN: [id number]  HPI: Whitney Bird is a 60 y.o. female presenting on 09/06/2021 for Dizziness   HPI   Pt had a negative covid 19 screening questionnaire.    Pt is 60yoF who is in today with complaints of dizziness.  Pt says she Feels dizzy just some of the time.   Sometimes it's all day, sometimes just for an hour or so.  she says she has no sob or cp.  She has No HA or vision changes.   She describes it as light-headed.  She says it isn't spinning.   She checks her BP- it was 130/82 at home  LMP approx 10 year  UA- + hgb in June, + CA oxalate crytstals  She has Never been told she had kidney stones  June 2022- ER visit for nose bleed     Relevant past medical, surgical, family and social history reviewed and updated as indicated. Interim medical history since our last visit reviewed. Allergies and medications reviewed and updated.   Current Outpatient Medications:    amLODipine (NORVASC) 10 MG tablet, Take 1 tablet (10 mg total) by mouth daily., Disp: 30 tablet, Rfl: 1   metoprolol tartrate (LOPRESSOR) 50 MG tablet, TAKE 1 TABLET(50 MG) BY MOUTH TWICE DAILY, Disp: 60 tablet, Rfl: 1    Review of Systems  Per HPI unless specifically indicated above     Objective:    Temp 97.8 F (36.6 C)   Wt 187 lb (84.8 kg)   SpO2 99%   BMI 31.12 kg/m   Wt Readings from Last 3 Encounters:  09/06/21 187 lb (84.8 kg)  07/31/21 189 lb 4.8 oz (85.9 kg)  07/08/21 193 lb (87.5 kg)     Orthostatic Vitals for the past 48 hrs (Last 6 readings):  Patient Position Orthostatic BP Orthostatic Pulse  09/06/21 1433 Supine 167/83 62  09/06/21 1434 Sitting (!) 167/93 70  09/06/21 1435 Standing (!) 166/95 74       Physical Exam Vitals reviewed.  Constitutional:      General: She is not in acute  distress.    Appearance: She is well-developed. She is not toxic-appearing.  HENT:     Head: Normocephalic and atraumatic.     Right Ear: Tympanic membrane, ear canal and external ear normal.     Left Ear: Tympanic membrane, ear canal and external ear normal.  Eyes:     Extraocular Movements: Extraocular movements intact.     Right eye: No nystagmus.     Left eye: No nystagmus.     Pupils: Pupils are equal, round, and reactive to light.  Cardiovascular:     Rate and Rhythm: Normal rate and regular rhythm.  Pulmonary:     Effort: Pulmonary effort is normal.     Breath sounds: Normal breath sounds.  Abdominal:     General: Bowel sounds are normal.     Palpations: Abdomen is soft. There is no mass.     Tenderness: There is no abdominal tenderness.  Musculoskeletal:     Cervical back: Neck supple.     Right lower leg: No edema.     Left lower leg: No edema.  Lymphadenopathy:     Cervical: No cervical adenopathy.  Skin:  General: Skin is warm and dry.  Neurological:     Mental Status: She is alert and oriented to person, place, and time.     Motor: No weakness or tremor.     Gait: Gait is intact.     Deep Tendon Reflexes:     Reflex Scores:      Patellar reflexes are 2+ on the right side and 2+ on the left side. Psychiatric:        Behavior: Behavior normal.    Results for orders placed or performed during the hospital encounter of 09/05/21  Urinalysis, Routine w reflex microscopic  Result Value Ref Range   Color, Urine STRAW (A) YELLOW   APPearance CLEAR CLEAR   Specific Gravity, Urine 1.003 (L) 1.005 - 1.030   pH 6.0 5.0 - 8.0   Glucose, UA NEGATIVE NEGATIVE mg/dL   Hgb urine dipstick MODERATE (A) NEGATIVE   Bilirubin Urine NEGATIVE NEGATIVE   Ketones, ur NEGATIVE NEGATIVE mg/dL   Protein, ur NEGATIVE NEGATIVE mg/dL   Nitrite NEGATIVE NEGATIVE   Leukocytes,Ua NEGATIVE NEGATIVE   RBC / HPF 0-5 0 - 5 RBC/hpf   WBC, UA 0-5 0 - 5 WBC/hpf   Bacteria, UA RARE (A) NONE  SEEN   Squamous Epithelial / LPF 0-5 0 - 5  B Nat Peptide  Result Value Ref Range   B Natriuretic Peptide 38.0 0.0 - 100.0 pg/mL  Lipid panel  Result Value Ref Range   Cholesterol 248 (H) 0 - 200 mg/dL   Triglycerides 76 <607 mg/dL   HDL 45 >37 mg/dL   Total CHOL/HDL Ratio 5.5 RATIO   VLDL 15 0 - 40 mg/dL   LDL Cholesterol 106 (H) 0 - 99 mg/dL  TSH  Result Value Ref Range   TSH 1.090 0.350 - 4.500 uIU/mL  Hemoglobin A1c  Result Value Ref Range   Hgb A1c MFr Bld 5.4 4.8 - 5.6 %   Mean Plasma Glucose 108.28 mg/dL  CBC w/Diff/Platelet  Result Value Ref Range   WBC 8.5 4.0 - 10.5 K/uL   RBC 4.36 3.87 - 5.11 MIL/uL   Hemoglobin 12.8 12.0 - 15.0 g/dL   HCT 26.9 48.5 - 46.2 %   MCV 91.7 80.0 - 100.0 fL   MCH 29.4 26.0 - 34.0 pg   MCHC 32.0 30.0 - 36.0 g/dL   RDW 70.3 50.0 - 93.8 %   Platelets 441 (H) 150 - 400 K/uL   nRBC 0.0 0.0 - 0.2 %   Neutrophils Relative % 49 %   Neutro Abs 4.1 1.7 - 7.7 K/uL   Lymphocytes Relative 44 %   Lymphs Abs 3.8 0.7 - 4.0 K/uL   Monocytes Relative 6 %   Monocytes Absolute 0.5 0.1 - 1.0 K/uL   Eosinophils Relative 1 %   Eosinophils Absolute 0.1 0.0 - 0.5 K/uL   Basophils Relative 0 %   Basophils Absolute 0.0 0.0 - 0.1 K/uL   Immature Granulocytes 0 %   Abs Immature Granulocytes 0.01 0.00 - 0.07 K/uL  Comprehensive metabolic panel  Result Value Ref Range   Sodium 135 135 - 145 mmol/L   Potassium 4.0 3.5 - 5.1 mmol/L   Chloride 102 98 - 111 mmol/L   CO2 25 22 - 32 mmol/L   Glucose, Bld 110 (H) 70 - 99 mg/dL   BUN 18 8 - 23 mg/dL   Creatinine, Ser 1.82 (H) 0.44 - 1.00 mg/dL   Calcium 9.6 8.9 - 99.3 mg/dL   Total Protein 8.4 (H)  6.5 - 8.1 g/dL   Albumin 4.3 3.5 - 5.0 g/dL   AST 12 (L) 15 - 41 U/L   ALT 7 0 - 44 U/L   Alkaline Phosphatase 59 38 - 126 U/L   Total Bilirubin 0.5 0.3 - 1.2 mg/dL   GFR, Estimated >28 >31 mL/min   Anion gap 8 5 - 15      Assessment & Plan:     Encounter Diagnoses  Name Primary?   Dizziness Yes    Primary hypertension    Hematuria, unspecified type    Hyperlipidemia, unspecified hyperlipidemia type       Labs reviewed with pt  Hematuria- discussed with pt .  She says she has never been told that before.  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  HTN-Cont amlodipine and metoprolol.  Add losartan.  Will not increase beta-blocker due to pt reports of light-headedness and lower pulse.  Pt to monitor bp  Dyslipidemia- pt is counseled on lowfat diet.  Will start atorvastatin  Renal function- Cr is slightly elevated.  Pt is counseled to stay hydrated.  Will monitor.     Pt to follow up 10/26 as scheduled.  She is to contact office sooner for any worsening or new symptoms

## 2021-09-18 ENCOUNTER — Telehealth: Payer: Self-pay

## 2021-09-18 NOTE — Telephone Encounter (Signed)
Call from pt in regards to how medications should be taken, info given per PCP instruction.

## 2021-10-03 ENCOUNTER — Ambulatory Visit: Payer: Self-pay | Admitting: Physician Assistant

## 2021-10-03 ENCOUNTER — Encounter: Payer: Self-pay | Admitting: Physician Assistant

## 2021-10-03 VITALS — BP 136/83

## 2021-10-03 DIAGNOSIS — I10 Essential (primary) hypertension: Secondary | ICD-10-CM

## 2021-10-03 NOTE — Progress Notes (Signed)
   BP 136/83    Subjective:    Patient ID: Whitney Bird, female    DOB: 04/18/1961, 60 y.o.   MRN: [id number]  HPI: Whitney Bird is a 60 y.o. female presenting on 10/03/2021 for No chief complaint on file.   HPI  This is a telemedicine appointment through [employer].  Pt was scheduled for in-person appointment but she called and reported exposure to covid so appointment was changed to virtual.   I connected with  Whitney Bird on 10/03/21 by a video enabled telemedicine application and verified that I am speaking with the correct person using two identifiers.   I discussed the limitations of evaluation and management by telemedicine. The patient expressed understanding and agreed to proceed.  Pt is at home.  Provider is at office.   Pt is 60yoF with appointment to follow up HTN.  She had Covid exposure about a week ago.   She feels fine and had test over the weekend that was negative.    She monitors her BP at home and says it has been good.  She has no complaints today.     Relevant past medical, surgical, family and social history reviewed and updated as indicated. Interim medical history since our last visit reviewed. Allergies and medications reviewed and updated.   Current Outpatient Medications:    amLODipine (NORVASC) 10 MG tablet, Take 1 tablet (10 mg total) by mouth daily., Disp: 90 tablet, Rfl: 1   atorvastatin (LIPITOR) 20 MG tablet, Take 1 tablet (20 mg total) by mouth daily., Disp: 90 tablet, Rfl: 1   losartan (COZAAR) 50 MG tablet, Take 1 tablet (50 mg total) by mouth daily., Disp: 90 tablet, Rfl: 0   metoprolol tartrate (LOPRESSOR) 50 MG tablet, TAKE 1 TABLET(50 MG) BY MOUTH TWICE DAILY, Disp: 180 tablet, Rfl: 0     Review of Systems  Per HPI unless specifically indicated above     Objective:    BP 136/83   Wt Readings from Last 3 Encounters:  09/06/21 187 lb (84.8 kg)  07/31/21 189 lb 4.8 oz (85.9 kg)  07/08/21 193 lb (87.5 kg)     Physical Exam Constitutional:      General: She is not in acute distress.    Appearance: She is not toxic-appearing.  HENT:     Head: Normocephalic and atraumatic.  Pulmonary:     Effort: No respiratory distress.  Neurological:     Mental Status: She is alert and oriented to person, place, and time.  Psychiatric:        Attention and Perception: Attention normal.        Speech: Speech normal.        Behavior: Behavior normal.          Assessment & Plan:   Encounter Diagnosis  Name Primary?   Primary hypertension Yes      -pt to continue current medications.  She is encouraged to monitor her BP and contact office if it starts running over 140. -pt to follow up 2 months.  She is to contact office sooner prn

## 2021-12-05 ENCOUNTER — Other Ambulatory Visit: Payer: Self-pay | Admitting: Physician Assistant

## 2021-12-05 DIAGNOSIS — I10 Essential (primary) hypertension: Secondary | ICD-10-CM

## 2021-12-05 DIAGNOSIS — E785 Hyperlipidemia, unspecified: Secondary | ICD-10-CM

## 2021-12-05 DIAGNOSIS — R7989 Other specified abnormal findings of blood chemistry: Secondary | ICD-10-CM

## 2021-12-06 ENCOUNTER — Other Ambulatory Visit: Payer: Self-pay | Admitting: Physician Assistant

## 2021-12-18 ENCOUNTER — Encounter: Payer: Self-pay | Admitting: Physician Assistant

## 2021-12-18 ENCOUNTER — Other Ambulatory Visit: Payer: Self-pay

## 2021-12-18 ENCOUNTER — Ambulatory Visit: Payer: Self-pay | Admitting: Physician Assistant

## 2021-12-18 VITALS — BP 155/83 | HR 78 | Temp 97.8°F | Wt 177.0 lb

## 2021-12-18 DIAGNOSIS — E785 Hyperlipidemia, unspecified: Secondary | ICD-10-CM

## 2021-12-18 DIAGNOSIS — I10 Essential (primary) hypertension: Secondary | ICD-10-CM

## 2021-12-18 DIAGNOSIS — R7989 Other specified abnormal findings of blood chemistry: Secondary | ICD-10-CM

## 2021-12-18 DIAGNOSIS — F419 Anxiety disorder, unspecified: Secondary | ICD-10-CM

## 2021-12-18 MED ORDER — LOSARTAN POTASSIUM 100 MG PO TABS: 100.0000 mg | ORAL_TABLET | Freq: Every day | ORAL | 0 refills | Status: DC

## 2021-12-18 NOTE — Progress Notes (Signed)
BP (!) 155/83    Pulse 78    Temp 97.8 F (36.6 C)    Wt 177 lb (80.3 kg)    SpO2 99%    BMI 29.45 kg/m    Subjective:    Patient ID: Whitney Bird, female    DOB: 1961/01/05, 61 y.o.   MRN: [id number]  HPI: Whitney Bird is a 61 y.o. female presenting on 12/18/2021 for Follow-up   HPI   Pt is 60yoF with routine appointment to follow up HTN and dyslipidemia.  Pt monitors her bp at home and says her BP was good until her brother was found dead About 2 weeks ago.  She says her bp [place] was 152/79.  She is having no feelings of light-headedness, HA, vision changes, CP.    She didn't get her labs drawn  She got her covid booster - bivalent  She does have some anxiety.   She denies depression    Relevant past medical, surgical, family and social history reviewed and updated as indicated. Interim medical history since our last visit reviewed. Allergies and medications reviewed and updated.   Current Outpatient Medications:    amLODipine (NORVASC) 10 MG tablet, Take 1 tablet (10 mg total) by mouth daily., Disp: 90 tablet, Rfl: 1   atorvastatin (LIPITOR) 20 MG tablet, Take 1 tablet (20 mg total) by mouth daily., Disp: 90 tablet, Rfl: 1   losartan (COZAAR) 50 MG tablet, TAKE 1 Tablet BY MOUTH ONCE DAILY, Disp: 90 tablet, Rfl: 0   metoprolol tartrate (LOPRESSOR) 50 MG tablet, TAKE 1 Tablet  BY MOUTH TWICE DAILY, Disp: 180 tablet, Rfl: 0    Review of Systems  Per HPI unless specifically indicated above     Objective:    BP (!) 155/83    Pulse 78    Temp 97.8 F (36.6 C)    Wt 177 lb (80.3 kg)    SpO2 99%    BMI 29.45 kg/m   Wt Readings from Last 3 Encounters:  12/18/21 177 lb (80.3 kg)  09/06/21 187 lb (84.8 kg)  07/31/21 189 lb 4.8 oz (85.9 kg)    Physical Exam Vitals reviewed.  Constitutional:      General: She is not in acute distress.    Appearance: She is well-developed. She is not ill-appearing.  HENT:     Head: Normocephalic and atraumatic.   Cardiovascular:     Rate and Rhythm: Normal rate and regular rhythm.  Pulmonary:     Effort: Pulmonary effort is normal.     Breath sounds: Normal breath sounds.  Abdominal:     General: Bowel sounds are normal.     Palpations: Abdomen is soft. There is no mass.     Tenderness: There is no abdominal tenderness.  Musculoskeletal:     Cervical back: Neck supple.     Right lower leg: No edema.     Left lower leg: No edema.  Lymphadenopathy:     Cervical: No cervical adenopathy.  Skin:    General: Skin is warm and dry.  Neurological:     Mental Status: She is alert and oriented to person, place, and time.  Psychiatric:        Attention and Perception: Attention normal.        Speech: Speech normal.        Behavior: Behavior normal. Behavior is cooperative.          Assessment & Plan:    Encounter Diagnoses  Name Primary?  Primary hypertension Yes   Hyperlipidemia, unspecified hyperlipidemia type    Elevated platelet count    Anxiety      -difficult to control.  She will continue amlodipine and metoprolol.  Will Increase losartan.  Consider renal US.  Pt to bring BP monitor with her to next appt to check accuracy -Pt to get labs drawn -will have [facility] contact pt to discuss anxiety/counseling -pt to follow up 4 weeks to recheck bp.  She is to contact office sooner prn

## 2022-01-15 ENCOUNTER — Ambulatory Visit: Payer: Self-pay | Admitting: Licensed Clinical Social Worker

## 2022-01-15 ENCOUNTER — Ambulatory Visit: Payer: Self-pay | Admitting: Physician Assistant

## 2022-01-18 ENCOUNTER — Other Ambulatory Visit (HOSPITAL_COMMUNITY)
Admission: RE | Admit: 2022-01-18 | Discharge: 2022-01-18 | Disposition: A | Discharge status: Home / Self Care | Payer: No Typology Code available for payment source | Source: Ambulatory Visit | Attending: Physician Assistant | Admitting: Physician Assistant

## 2022-01-18 DIAGNOSIS — R7989 Other specified abnormal findings of blood chemistry: Secondary | ICD-10-CM | POA: Insufficient documentation

## 2022-01-18 DIAGNOSIS — I10 Essential (primary) hypertension: Secondary | ICD-10-CM | POA: Insufficient documentation

## 2022-01-18 DIAGNOSIS — E785 Hyperlipidemia, unspecified: Secondary | ICD-10-CM | POA: Insufficient documentation

## 2022-01-18 LAB — CBC
HCT: 38.4 % (ref 36.0–46.0)
Hemoglobin: 12.5 g/dL (ref 12.0–15.0)
MCH: 29 pg (ref 26.0–34.0)
MCHC: 32.6 g/dL (ref 30.0–36.0)
MCV: 89.1 fL (ref 80.0–100.0)
Platelets: 425 10*3/uL — ABNORMAL HIGH (ref 150–400)
RBC: 4.31 MIL/uL (ref 3.87–5.11)
RDW: 14 % (ref 11.5–15.5)
WBC: 7.5 10*3/uL (ref 4.0–10.5)
nRBC: 0 % (ref 0.0–0.2)

## 2022-01-18 LAB — COMPREHENSIVE METABOLIC PANEL
ALT: 10 U/L (ref 0–44)
AST: 13 U/L — ABNORMAL LOW (ref 15–41)
Albumin: 4 g/dL (ref 3.5–5.0)
Alkaline Phosphatase: 54 U/L (ref 38–126)
Anion gap: 7 (ref 5–15)
BUN: 11 mg/dL (ref 8–23)
CO2: 23 mmol/L (ref 22–32)
Calcium: 9.1 mg/dL (ref 8.9–10.3)
Chloride: 105 mmol/L (ref 98–111)
Creatinine, Ser: 0.99 mg/dL (ref 0.44–1.00)
GFR, Estimated: >60 mL/min (ref >60)
Glucose, Bld: 106 mg/dL — ABNORMAL HIGH (ref 70–99)
Potassium: 3.9 mmol/L (ref 3.5–5.1)
Sodium: 135 mmol/L (ref 135–145)
Total Bilirubin: 0.5 mg/dL (ref 0.3–1.2)
Total Protein: 8 g/dL (ref 6.5–8.1)

## 2022-01-18 LAB — LIPID PANEL
Cholesterol: 142 mg/dL (ref 0–200)
HDL: 44 mg/dL (ref >40)
LDL Cholesterol: 89 mg/dL (ref 0–99)
Total CHOL/HDL Ratio: 3.2 RATIO
Triglycerides: 43 mg/dL (ref <150)
VLDL: 9 mg/dL (ref 0–40)

## 2022-01-22 ENCOUNTER — Ambulatory Visit: Payer: Self-pay | Admitting: Physician Assistant

## 2022-01-22 ENCOUNTER — Ambulatory Visit: Payer: Self-pay | Admitting: Licensed Clinical Social Worker

## 2022-01-29 ENCOUNTER — Other Ambulatory Visit: Payer: Self-pay

## 2022-01-29 ENCOUNTER — Ambulatory Visit: Payer: Self-pay | Admitting: Physician Assistant

## 2022-01-29 ENCOUNTER — Encounter: Payer: Self-pay | Admitting: Physician Assistant

## 2022-01-29 VITALS — BP 189/97 | HR 80 | Temp 97.0°F | Wt 180.0 lb

## 2022-01-29 DIAGNOSIS — I10 Essential (primary) hypertension: Secondary | ICD-10-CM

## 2022-01-29 DIAGNOSIS — R42 Dizziness and giddiness: Secondary | ICD-10-CM

## 2022-01-29 DIAGNOSIS — R7989 Other specified abnormal findings of blood chemistry: Secondary | ICD-10-CM

## 2022-01-29 DIAGNOSIS — F419 Anxiety disorder, unspecified: Secondary | ICD-10-CM

## 2022-01-29 DIAGNOSIS — E785 Hyperlipidemia, unspecified: Secondary | ICD-10-CM

## 2022-01-29 NOTE — Progress Notes (Signed)
BP (!) 189/97    Pulse 80    Temp (!) 97 F (36.1 C)    Wt 180 lb (81.6 kg)    SpO2 98%    BMI 29.95 kg/m    Subjective:    Patient ID: Whitney Bird, female    DOB: 1961-08-22, 61 y.o.   MRN: [id number]  HPI: Whitney Bird is a 61 y.o. female presenting on 01/29/2022 for Hypertension   HPI   Chief Complaint  Patient presents with   Hypertension     Pt brought in her home bp machine.  The history on her bp machine:    132/81, 150/92, 137/83, 152/84135/83  In office: Her bp machine: 176/95 Our bp check welch allyn machine- 188/95   She self changed her losartan to 50mg .  She was prescribed 100mg .   She felt dizzy which is why she changed.    She says the dizzy feeling is all the time.  Sometimes it only lasts a few mnutes, and sometimes it lasts all day.  She describes it as feeling "Light-headed",  "Out of body",    "a Surreal feeling".     It is not brought on by standing up or reclining- change of position does not affect it except may help it improved some.    She says it does Not feel like spinning.    She has appt tomorrow with [facility]     Relevant past medical, surgical, family and social history reviewed and updated as indicated. Interim medical history since our last visit reviewed. Allergies and medications reviewed and updated.    Current Outpatient Medications:    amLODipine (NORVASC) 10 MG tablet, Take 1 tablet (10 mg total) by mouth daily., Disp: 90 tablet, Rfl: 1   atorvastatin (LIPITOR) 20 MG tablet, Take 1 tablet (20 mg total) by mouth daily., Disp: 90 tablet, Rfl: 1   losartan (COZAAR) 50 MG tablet, Take 50 mg by mouth daily., Disp: , Rfl:    metoprolol tartrate (LOPRESSOR) 50 MG tablet, TAKE 1 Tablet  BY MOUTH TWICE DAILY, Disp: 180 tablet, Rfl: 0   losartan (COZAAR) 100 MG tablet, Take 1 tablet (100 mg total) by mouth daily. (Patient not taking: Reported on 01/29/2022), Disp: 90 tablet, Rfl: 0    Review of Systems  Per HPI unless  specifically indicated above     Objective:    BP (!) 189/97    Pulse 80    Temp (!) 97 F (36.1 C)    Wt 180 lb (81.6 kg)    SpO2 98%    BMI 29.95 kg/m   Wt Readings from Last 3 Encounters:  01/29/22 180 lb (81.6 kg)  12/18/21 177 lb (80.3 kg)  09/06/21 187 lb (84.8 kg)    Physical Exam Vitals reviewed.  Constitutional:      General: She is not in acute distress.    Appearance: She is well-developed. She is not ill-appearing.  HENT:     Head: Normocephalic and atraumatic.     Right Ear: Tympanic membrane, ear canal and external ear normal.     Left Ear: Tympanic membrane, ear canal and external ear normal.  Cardiovascular:     Rate and Rhythm: Normal rate and regular rhythm.  Pulmonary:     Effort: Pulmonary effort is normal.     Breath sounds: Normal breath sounds.  Abdominal:     General: Bowel sounds are normal.     Palpations: Abdomen is soft. There is no mass.  Tenderness: There is no abdominal tenderness.  Musculoskeletal:     Cervical back: Neck supple.     Right lower leg: No edema.     Left lower leg: No edema.  Lymphadenopathy:     Cervical: No cervical adenopathy.  Skin:    General: Skin is warm and dry.  Neurological:     Mental Status: She is alert and oriented to person, place, and time.  Psychiatric:        Behavior: Behavior normal.    Results for orders placed or performed during the hospital encounter of 01/18/22  CBC  Result Value Ref Range   WBC 7.5 4.0 - 10.5 K/uL   RBC 4.31 3.87 - 5.11 MIL/uL   Hemoglobin 12.5 12.0 - 15.0 g/dL   HCT 41.3 24.4 - 01.0 %   MCV 89.1 80.0 - 100.0 fL   MCH 29.0 26.0 - 34.0 pg   MCHC 32.6 30.0 - 36.0 g/dL   RDW 27.2 53.6 - 64.4 %   Platelets 425 (H) 150 - 400 K/uL   nRBC 0.0 0.0 - 0.2 %  Lipid panel  Result Value Ref Range   Cholesterol 142 0 - 200 mg/dL   Triglycerides 43 <034 mg/dL   HDL 44 >74 mg/dL   Total CHOL/HDL Ratio 3.2 RATIO   VLDL 9 0 - 40 mg/dL   LDL Cholesterol 89 0 - 99 mg/dL   Comprehensive metabolic panel  Result Value Ref Range   Sodium 135 135 - 145 mmol/L   Potassium 3.9 3.5 - 5.1 mmol/L   Chloride 105 98 - 111 mmol/L   CO2 23 22 - 32 mmol/L   Glucose, Bld 106 (H) 70 - 99 mg/dL   BUN 11 8 - 23 mg/dL   Creatinine, Ser 2.59 0.44 - 1.00 mg/dL   Calcium 9.1 8.9 - 56.3 mg/dL   Total Protein 8.0 6.5 - 8.1 g/dL   Albumin 4.0 3.5 - 5.0 g/dL   AST 13 (L) 15 - 41 U/L   ALT 10 0 - 44 U/L   Alkaline Phosphatase 54 38 - 126 U/L   Total Bilirubin 0.5 0.3 - 1.2 mg/dL   GFR, Estimated >87 >56 mL/min   Anion gap 7 5 - 15      Assessment & Plan:   Encounter Diagnoses  Name Primary?   Primary hypertension Yes   Anxiety    Dizziness    Hyperlipidemia, unspecified hyperlipidemia type    Elevated platelet count        -Reviewed labs with pt -pt to Resume 100mg  losartan -she is to continue her other medications -counseling appointment as scheduled -pt to follow up 1 month.  She is to contact office sooner prn

## 2022-01-29 NOTE — Patient Instructions (Addendum)
Resume taking  100mg  losartan

## 2022-01-30 ENCOUNTER — Ambulatory Visit: Payer: Self-pay | Admitting: Licensed Clinical Social Worker

## 2022-02-25 ENCOUNTER — Other Ambulatory Visit: Payer: Self-pay | Admitting: Physician Assistant

## 2022-02-27 ENCOUNTER — Ambulatory Visit: Payer: Self-pay | Admitting: Physician Assistant

## 2022-02-27 ENCOUNTER — Other Ambulatory Visit: Payer: Self-pay

## 2022-02-27 ENCOUNTER — Encounter: Payer: Self-pay | Admitting: Physician Assistant

## 2022-02-27 ENCOUNTER — Ambulatory Visit: Payer: Self-pay | Admitting: Licensed Clinical Social Worker

## 2022-02-27 VITALS — BP 148/72 | HR 64 | Temp 97.1°F | Wt 178.0 lb

## 2022-02-27 DIAGNOSIS — F4321 Adjustment disorder with depressed mood: Secondary | ICD-10-CM

## 2022-02-27 DIAGNOSIS — I10 Essential (primary) hypertension: Secondary | ICD-10-CM

## 2022-02-27 MED ORDER — LOSARTAN POTASSIUM 100 MG PO TABS: 100.0000 mg | ORAL_TABLET | Freq: Every day | ORAL | 1 refills | Status: DC

## 2022-02-27 NOTE — Patient Instructions (Signed)

## 2022-02-27 NOTE — Progress Notes (Signed)
? ?  BP (!) 148/72   Pulse 64   Temp (!) 97.1 ?F (36.2 ?C)   Wt 178 lb (80.7 kg)   SpO2 99%   BMI 29.62 kg/m?   ? ?Subjective:  ? ? Patient ID: Whitney Bird, female    DOB: 08-19-1961, 61 y.o.   MRN: [id number] ? ?HPI: ?AUDREE SCHRECENGOST is a 61 y.o. female presenting on 02/27/2022 for Hypertension ? ? ?HPI ? ? ?Chief Complaint  ?Patient presents with  ? Hypertension  ? ? ?Pt is checking her bp at home.  Range from 120s to 150s, pulse mostly 50s and 60s.   Pt says she doesn't feel different when her pulse is low and she only knows when she sees it on the monitor. ? ? ?Relevant past medical, surgical, family and social history reviewed and updated as indicated. Interim medical history since our last visit reviewed. ?Allergies and medications reviewed and updated. ? ? ?Current Outpatient Medications:  ?  amLODipine (NORVASC) 10 MG tablet, TAKE 1 Tablet BY MOUTH ONCE DAILY, Disp: 90 tablet, Rfl: 1 ?  atorvastatin (LIPITOR) 20 MG tablet, TAKE 1 Tablet BY MOUTH ONCE DAILY, Disp: 90 tablet, Rfl: 1 ?  metoprolol tartrate (LOPRESSOR) 50 MG tablet, TAKE 1 Tablet  BY MOUTH TWICE DAILY, Disp: 180 tablet, Rfl: 1 ?  losartan (COZAAR) 100 MG tablet, Take 1 tablet (100 mg total) by mouth daily., Disp: 90 tablet, Rfl: 1 ? ? ? ?Review of Systems ? ?Per HPI unless specifically indicated above ? ?   ?Objective:  ?  ?BP (!) 148/72   Pulse 64   Temp (!) 97.1 ?F (36.2 ?C)   Wt 178 lb (80.7 kg)   SpO2 99%   BMI 29.62 kg/m?   ?Wt Readings from Last 3 Encounters:  ?02/27/22 178 lb (80.7 kg)  ?01/29/22 180 lb (81.6 kg)  ?12/18/21 177 lb (80.3 kg)  ?  ?Physical Exam ?Vitals reviewed.  ?Constitutional:   ?   General: She is not in acute distress. ?   Appearance: She is well-developed. She is not toxic-appearing.  ?HENT:  ?   Head: Normocephalic and atraumatic.  ?Cardiovascular:  ?   Rate and Rhythm: Normal rate and regular rhythm.  ?Pulmonary:  ?   Effort: Pulmonary effort is normal.  ?   Breath sounds: Normal breath sounds.   ?Musculoskeletal:  ?   Cervical back: Neck supple.  ?Lymphadenopathy:  ?   Cervical: No cervical adenopathy.  ?Skin: ?   General: Skin is warm and dry.  ?Neurological:  ?   Mental Status: She is alert and oriented to person, place, and time.  ?Psychiatric:     ?   Behavior: Behavior normal.  ? ? ? ? ? ?   ?Assessment & Plan:  ? ?Encounter Diagnosis  ?Name Primary?  ? Primary hypertension Yes  ? ? ? ? ?-will refer pt to Home bp monitoring program through [facility] ?-pt to Continue current meds ?-will Consider change off of beta blocker due to low HR at next appointment if needed ?-pt was given reading information on dash diet to help with her BP ?-pt to follow up 1 month.  She is to contact office sooner prn ?

## 2022-02-27 NOTE — Progress Notes (Signed)
[facility] engaged patient in follow-up session. [facility] provided active listening and validation as patient shared about grief related to recent loss. Therapist led patient in visualization breathing exercise.  ?

## 2022-03-26 ENCOUNTER — Encounter: Payer: Self-pay | Admitting: Physician Assistant

## 2022-03-26 ENCOUNTER — Ambulatory Visit: Payer: Self-pay | Admitting: Physician Assistant

## 2022-03-26 VITALS — BP 121/77

## 2022-03-26 DIAGNOSIS — I10 Essential (primary) hypertension: Secondary | ICD-10-CM

## 2022-03-26 NOTE — Progress Notes (Signed)
? ?  There were no vitals taken for this visit.  ? ?Subjective:  ? ? Patient ID: Whitney Bird, female    DOB: Mar 02, 1961, 61 y.o.   MRN: [id number] ? ?HPI: ?Whitney Bird is a 61 y.o. female presenting on 03/26/2022 for No chief complaint on file. ? ? ?HPI ? ?This is a telemedicine appointment through [employer]. ? ?I connected with  Whitney Bird on 03/26/22 by a video enabled telemedicine application and verified that I am speaking with the correct person using two identifiers. ?  ?I discussed the limitations of evaluation and management by telemedicine. The patient expressed understanding and agreed to proceed. ? ?Pt is at home.  Provider is at office. ? ? ? ?Pt was scheduled for in-person appointment but was changed due to pt has problems with transportation. ? ?She is now particpating in the HTN monitoring program.  She says she usually checks her bp around 9 or 9:30 am so she hasn't checked it yet today.  Yesterday her two readings were 121/77 and 107/71.  She says she is feeling well and she has no complaints.    She says she is doing well with her grief and does not need BHC at this time.  ? ? ? ?Relevant past medical, surgical, family and social history reviewed and updated as indicated. Interim medical history since our last visit reviewed. ?Allergies and medications reviewed and updated. ? ? ?Current Outpatient Medications:  ?  amLODipine (NORVASC) 10 MG tablet, TAKE 1 Tablet BY MOUTH ONCE DAILY, Disp: 90 tablet, Rfl: 1 ?  atorvastatin (LIPITOR) 20 MG tablet, TAKE 1 Tablet BY MOUTH ONCE DAILY, Disp: 90 tablet, Rfl: 1 ?  losartan (COZAAR) 100 MG tablet, Take 1 tablet (100 mg total) by mouth daily., Disp: 90 tablet, Rfl: 1 ?  metoprolol tartrate (LOPRESSOR) 50 MG tablet, TAKE 1 Tablet  BY MOUTH TWICE DAILY, Disp: 180 tablet, Rfl: 1 ? ? ? ?Review of Systems ? ?Per HPI unless specifically indicated above ? ?   ?Objective:  ?  ?There were no vitals taken for this visit.  ?Wt Readings from Last 3  Encounters:  ?02/27/22 178 lb (80.7 kg)  ?01/29/22 180 lb (81.6 kg)  ?12/18/21 177 lb (80.3 kg)  ?  ?Vitals:  ? 03/26/22 0835  ?BP: 121/77  ? ? ? ? ? ?Physical Exam ?Constitutional:   ?   General: She is not in acute distress. ?   Appearance: She is not toxic-appearing.  ?HENT:  ?   Head: Normocephalic and atraumatic.  ?Pulmonary:  ?   Effort: No respiratory distress.  ?   Comments: Pt is talking in complete sentences without dyspnea ?Neurological:  ?   Mental Status: She is alert and oriented to person, place, and time.  ?Psychiatric:     ?   Attention and Perception: Attention normal.     ?   Mood and Affect: Mood normal.     ?   Speech: Speech normal.     ?   Behavior: Behavior normal. Behavior is cooperative.  ? ? ? ? ? ?   ?Assessment & Plan:  ? ?Encounter Diagnosis  ?Name Primary?  ? Primary hypertension Yes  ? ? ? ?-Pt to continue current medications.   ?-Pt to continue to monitor bp at home ?-Pt to follow up end of May.  She is to contact office sooner prn ? ? ?

## 2022-04-01 ENCOUNTER — Telehealth: Payer: Self-pay

## 2022-04-17 ENCOUNTER — Other Ambulatory Visit: Payer: Self-pay | Admitting: Physician Assistant

## 2022-04-17 DIAGNOSIS — R7989 Other specified abnormal findings of blood chemistry: Secondary | ICD-10-CM

## 2022-04-17 DIAGNOSIS — E785 Hyperlipidemia, unspecified: Secondary | ICD-10-CM

## 2022-04-17 DIAGNOSIS — I10 Essential (primary) hypertension: Secondary | ICD-10-CM

## 2022-05-07 ENCOUNTER — Encounter: Payer: Self-pay | Admitting: Physician Assistant

## 2022-05-07 ENCOUNTER — Ambulatory Visit: Payer: Self-pay | Admitting: Physician Assistant

## 2022-05-07 VITALS — BP 166/90 | Temp 97.5°F | Wt 181.0 lb

## 2022-05-07 DIAGNOSIS — E785 Hyperlipidemia, unspecified: Secondary | ICD-10-CM

## 2022-05-07 DIAGNOSIS — R7989 Other specified abnormal findings of blood chemistry: Secondary | ICD-10-CM

## 2022-05-07 DIAGNOSIS — I10 Essential (primary) hypertension: Secondary | ICD-10-CM

## 2022-05-07 NOTE — Progress Notes (Signed)
BP (!) 166/90   Temp (!) 97.5 F (36.4 C)   Wt 181 lb (82.1 kg)   BMI 30.12 kg/m    Subjective:    Patient ID: Whitney Bird, female    DOB: [birth year], 61 y.o.   MRN: [id number]  HPI: Whitney Bird is a 61 y.o. female presenting on 05/07/2022 for Hypertension and Hyperlipidemia   HPI  Chief Complaint  Patient presents with   Hypertension   Hyperlipidemia     Pt participates in the BP monitoring program: Avg 122/80 Highest 16/97 Lowest 94/60 110 readings/60 days   Pt says she is feeling good.  She says her Dizziness is resolving.  She is drinking more water.  She is becoming more active.   She says she is having no anxiety or depression.   She has no complaints today.      Relevant past medical, surgical, family and social history reviewed and updated as indicated. Interim medical history since our last visit reviewed. Allergies and medications reviewed and updated.    Current Outpatient Medications:    amLODipine (NORVASC) 10 MG tablet, TAKE 1 Tablet BY MOUTH ONCE DAILY, Disp: 90 tablet, Rfl: 1   atorvastatin (LIPITOR) 20 MG tablet, TAKE 1 Tablet BY MOUTH ONCE DAILY, Disp: 90 tablet, Rfl: 1   losartan (COZAAR) 100 MG tablet, Take 1 tablet (100 mg total) by mouth daily., Disp: 90 tablet, Rfl: 1   metoprolol tartrate (LOPRESSOR) 50 MG tablet, TAKE 1 Tablet  BY MOUTH TWICE DAILY, Disp: 180 tablet, Rfl: 1    Review of Systems  Per HPI unless specifically indicated above     Objective:    BP (!) 166/90   Temp (!) 97.5 F (36.4 C)   Wt 181 lb (82.1 kg)   BMI 30.12 kg/m   Wt Readings from Last 3 Encounters:  05/07/22 181 lb (82.1 kg)  02/27/22 178 lb (80.7 kg)  01/29/22 180 lb (81.6 kg)    Physical Exam Vitals reviewed.  Constitutional:      General: She is not in acute distress.    Appearance: She is well-developed. She is not toxic-appearing.  HENT:     Head: Normocephalic and atraumatic.  Cardiovascular:     Rate and Rhythm:  Normal rate and regular rhythm.  Pulmonary:     Effort: Pulmonary effort is normal.     Breath sounds: Normal breath sounds.  Abdominal:     General: Bowel sounds are normal.     Palpations: Abdomen is soft. There is no mass.     Tenderness: There is no abdominal tenderness.  Musculoskeletal:     Cervical back: Neck supple.     Right lower leg: No edema.     Left lower leg: No edema.  Lymphadenopathy:     Cervical: No cervical adenopathy.  Skin:    General: Skin is warm and dry.  Neurological:     Mental Status: She is alert and oriented to person, place, and time.  Psychiatric:        Behavior: Behavior normal.          Assessment & Plan:   Encounter Diagnoses  Name Primary?   Primary hypertension Yes   Hyperlipidemia, unspecified hyperlipidemia type    Elevated platelet count       -Pt to get fasting labs drawn.  She will be called with results -Record request sent to [facility] for most recent PAP -pt to continue current medications.  She is encouraged to continue to  monitor her bp -pt to follow up 3 months.  She is to contact office sooner prn.  She will be due for FIT test and mammogram at next appointment

## 2022-05-08 ENCOUNTER — Encounter: Payer: Self-pay | Admitting: Physician Assistant

## 2022-08-05 ENCOUNTER — Other Ambulatory Visit (HOSPITAL_COMMUNITY)
Admission: RE | Admit: 2022-08-05 | Discharge: 2022-08-05 | Disposition: A | Discharge status: Home / Self Care | Payer: Self-pay | Source: Ambulatory Visit | Attending: Physician Assistant | Admitting: Physician Assistant

## 2022-08-05 DIAGNOSIS — E785 Hyperlipidemia, unspecified: Secondary | ICD-10-CM | POA: Insufficient documentation

## 2022-08-05 DIAGNOSIS — I10 Essential (primary) hypertension: Secondary | ICD-10-CM | POA: Insufficient documentation

## 2022-08-05 DIAGNOSIS — R7989 Other specified abnormal findings of blood chemistry: Secondary | ICD-10-CM | POA: Insufficient documentation

## 2022-08-05 LAB — COMPREHENSIVE METABOLIC PANEL
ALT: 8 U/L (ref 0–44)
AST: 13 U/L — ABNORMAL LOW (ref 15–41)
Albumin: 3.9 g/dL (ref 3.5–5.0)
Alkaline Phosphatase: 49 U/L (ref 38–126)
Anion gap: 6 (ref 5–15)
BUN: 12 mg/dL (ref 8–23)
CO2: 24 mmol/L (ref 22–32)
Calcium: 9.4 mg/dL (ref 8.9–10.3)
Chloride: 108 mmol/L (ref 98–111)
Creatinine, Ser: 1.14 mg/dL — ABNORMAL HIGH (ref 0.44–1.00)
GFR, Estimated: 54 mL/min — ABNORMAL LOW (ref >60)
Glucose, Bld: 102 mg/dL — ABNORMAL HIGH (ref 70–99)
Potassium: 4.1 mmol/L (ref 3.5–5.1)
Sodium: 138 mmol/L (ref 135–145)
Total Bilirubin: 1 mg/dL (ref 0.3–1.2)
Total Protein: 7.7 g/dL (ref 6.5–8.1)

## 2022-08-05 LAB — CBC
HCT: 37 % (ref 36.0–46.0)
Hemoglobin: 11.8 g/dL — ABNORMAL LOW (ref 12.0–15.0)
MCH: 28.9 pg (ref 26.0–34.0)
MCHC: 31.9 g/dL (ref 30.0–36.0)
MCV: 90.7 fL (ref 80.0–100.0)
Platelets: 403 10*3/uL — ABNORMAL HIGH (ref 150–400)
RBC: 4.08 MIL/uL (ref 3.87–5.11)
RDW: 14 % (ref 11.5–15.5)
WBC: 8.4 10*3/uL (ref 4.0–10.5)
nRBC: 0 % (ref 0.0–0.2)

## 2022-08-05 LAB — LIPID PANEL
Cholesterol: 142 mg/dL (ref 0–200)
HDL: 45 mg/dL (ref >40)
LDL Cholesterol: 86 mg/dL (ref 0–99)
Total CHOL/HDL Ratio: 3.2 RATIO
Triglycerides: 55 mg/dL (ref <150)
VLDL: 11 mg/dL (ref 0–40)

## 2022-08-13 ENCOUNTER — Encounter: Payer: Self-pay | Admitting: Physician Assistant

## 2022-08-13 ENCOUNTER — Ambulatory Visit: Payer: Self-pay | Admitting: Physician Assistant

## 2022-08-13 VITALS — BP 165/88 | HR 73 | Temp 97.9°F

## 2022-08-13 DIAGNOSIS — Z1239 Encounter for other screening for malignant neoplasm of breast: Secondary | ICD-10-CM

## 2022-08-13 DIAGNOSIS — Z1211 Encounter for screening for malignant neoplasm of colon: Secondary | ICD-10-CM

## 2022-08-13 DIAGNOSIS — E785 Hyperlipidemia, unspecified: Secondary | ICD-10-CM

## 2022-08-13 DIAGNOSIS — Z5320 Procedure and treatment not carried out because of patient's decision for unspecified reasons: Secondary | ICD-10-CM

## 2022-08-13 DIAGNOSIS — I10 Essential (primary) hypertension: Secondary | ICD-10-CM

## 2022-08-13 NOTE — Progress Notes (Signed)
BP (!) 165/88   Pulse 73   Temp 97.9 F (36.6 C)   SpO2 99%    Subjective:    Patient ID: Whitney Bird, female    DOB: 1961/01/05, 61 y.o.   MRN: [id number]  HPI: Whitney Bird is a 61 y.o. female presenting on 08/13/2022 for Hypertension and Hyperlipidemia   HPI    Chief Complaint  Patient presents with   Hypertension   Hyperlipidemia     She checks bp at home, once/day.  She uses equipment she was given when participating in home bp monitoring program.   She says this morning her bp was 108/77 which is typical for her.     She has been doing good and she has no complaints.     Relevant past medical, surgical, family and social history reviewed and updated as indicated. Interim medical history since our last visit reviewed. Allergies and medications reviewed and updated.   Current Outpatient Medications:    amLODipine (NORVASC) 10 MG tablet, TAKE 1 Tablet BY MOUTH ONCE DAILY, Disp: 90 tablet, Rfl: 1   atorvastatin (LIPITOR) 20 MG tablet, TAKE 1 Tablet BY MOUTH ONCE DAILY, Disp: 90 tablet, Rfl: 1   losartan (COZAAR) 100 MG tablet, Take 1 tablet (100 mg total) by mouth daily., Disp: 90 tablet, Rfl: 1   metoprolol tartrate (LOPRESSOR) 50 MG tablet, TAKE 1 Tablet  BY MOUTH TWICE DAILY, Disp: 180 tablet, Rfl: 1   Review of Systems  Per HPI unless specifically indicated above     Objective:    BP (!) 165/88   Pulse 73   Temp 97.9 F (36.6 C)   SpO2 99%   Wt Readings from Last 3 Encounters:  05/07/22 181 lb (82.1 kg)  02/27/22 178 lb (80.7 kg)  01/29/22 180 lb (81.6 kg)    Physical Exam Vitals reviewed.  Constitutional:      General: She is not in acute distress.    Appearance: She is well-developed. She is not toxic-appearing.  HENT:     Head: Normocephalic and atraumatic.  Cardiovascular:     Rate and Rhythm: Normal rate and regular rhythm.  Pulmonary:     Effort: Pulmonary effort is normal.     Breath sounds: Normal breath sounds.   Abdominal:     General: Bowel sounds are normal.     Palpations: Abdomen is soft. There is no mass.     Tenderness: There is no abdominal tenderness.  Musculoskeletal:     Cervical back: Neck supple.     Right lower leg: No edema.     Left lower leg: No edema.  Lymphadenopathy:     Cervical: No cervical adenopathy.  Skin:    General: Skin is warm and dry.  Neurological:     Mental Status: She is alert and oriented to person, place, and time.  Psychiatric:        Behavior: Behavior normal.     Results for orders placed or performed during the hospital encounter of 08/05/22  CBC  Result Value Ref Range   WBC 8.4 4.0 - 10.5 K/uL   RBC 4.08 3.87 - 5.11 MIL/uL   Hemoglobin 11.8 (L) 12.0 - 15.0 g/dL   HCT 36.6 29.4 - 76.5 %   MCV 90.7 80.0 - 100.0 fL   MCH 28.9 26.0 - 34.0 pg   MCHC 31.9 30.0 - 36.0 g/dL   RDW 46.5 03.5 - 46.5 %   Platelets 403 (H) 150 - 400 K/uL  nRBC 0.0 0.0 - 0.2 %  Lipid panel  Result Value Ref Range   Cholesterol 142 0 - 200 mg/dL   Triglycerides 55 <528 mg/dL   HDL 45 >41 mg/dL   Total CHOL/HDL Ratio 3.2 RATIO   VLDL 11 0 - 40 mg/dL   LDL Cholesterol 86 0 - 99 mg/dL  Comprehensive metabolic panel  Result Value Ref Range   Sodium 138 135 - 145 mmol/L   Potassium 4.1 3.5 - 5.1 mmol/L   Chloride 108 98 - 111 mmol/L   CO2 24 22 - 32 mmol/L   Glucose, Bld 102 (H) 70 - 99 mg/dL   BUN 12 8 - 23 mg/dL   Creatinine, Ser 3.24 (H) 0.44 - 1.00 mg/dL   Calcium 9.4 8.9 - 40.1 mg/dL   Total Protein 7.7 6.5 - 8.1 g/dL   Albumin 3.9 3.5 - 5.0 g/dL   AST 13 (L) 15 - 41 U/L   ALT 8 0 - 44 U/L   Alkaline Phosphatase 49 38 - 126 U/L   Total Bilirubin 1.0 0.3 - 1.2 mg/dL   GFR, Estimated 54 (L) >60 mL/min   Anion gap 6 5 - 15      Assessment & Plan:    Encounter Diagnoses  Name Primary?   Primary hypertension Yes   Hyperlipidemia, unspecified hyperlipidemia type    Encounter for screening for malignant neoplasm of breast, unspecified screening modality     White coat syndrome with diagnosis of hypertension    Pap smear of cervix declined    Screening for colon cancer      -reviewed labs with pt  -pt to continue her current medications -pt encouraged to Drink 8 large glasses water daily to help renal function.  She does not use nsaids.   -Pt to continue to monitor bp at home.  She is to contact office if it starts running over 140 -pt is referred for screening Mammogram -pt declined screening pap -pt is given FIT test for colon cancer screening -pt to follow up 3 months.  She is to contact office sooner prn

## 2022-08-19 ENCOUNTER — Other Ambulatory Visit: Payer: Self-pay | Admitting: Physician Assistant

## 2022-08-19 DIAGNOSIS — Z1211 Encounter for screening for malignant neoplasm of colon: Secondary | ICD-10-CM

## 2022-08-19 LAB — POC FIT TEST STOOL: Fecal Occult Blood: NEGATIVE

## 2022-08-26 ENCOUNTER — Other Ambulatory Visit: Payer: Self-pay | Admitting: Physician Assistant

## 2022-10-29 ENCOUNTER — Other Ambulatory Visit: Payer: Self-pay | Admitting: Physician Assistant

## 2022-10-29 DIAGNOSIS — I10 Essential (primary) hypertension: Secondary | ICD-10-CM

## 2022-10-29 DIAGNOSIS — E785 Hyperlipidemia, unspecified: Secondary | ICD-10-CM

## 2022-11-12 ENCOUNTER — Telehealth: Payer: Self-pay

## 2022-11-12 ENCOUNTER — Ambulatory Visit: Payer: Self-pay | Admitting: Physician Assistant

## 2022-11-12 VITALS — BP 165/89 | HR 64 | Temp 97.8°F

## 2022-11-12 DIAGNOSIS — I10 Essential (primary) hypertension: Secondary | ICD-10-CM

## 2022-11-12 DIAGNOSIS — Z1239 Encounter for other screening for malignant neoplasm of breast: Secondary | ICD-10-CM

## 2022-11-12 DIAGNOSIS — E785 Hyperlipidemia, unspecified: Secondary | ICD-10-CM

## 2022-11-12 NOTE — Telephone Encounter (Signed)
Telephoned patient at mobile number. Left a voice message with [facility] ([facility]) contact information. 

## 2022-11-12 NOTE — Progress Notes (Unsigned)
   BP (!) 165/89   Pulse 64   Temp 97.8 F (36.6 C)   SpO2 99%    Subjective:    Patient ID: Whitney Bird, female    DOB: 1961-10-10, 61 y.o.   MRN: [id number]  HPI: Whitney Bird is a 61 y.o. female presenting on 11/12/2022 for Hypertension and Hyperlipidemia   HPI   Chief Complaint  Patient presents with   Hypertension   Hyperlipidemia    Pt says her bp was 125/72 at home earlier today  She is feeling good today  She didn't get labs drawn  She is having No sob, cp or abdominal pain.  She has no complaints.      Relevant past medical, surgical, family and social history reviewed and updated as indicated. Interim medical history since our last visit reviewed. Allergies and medications reviewed and updated.   Current Outpatient Medications:    amLODipine (NORVASC) 10 MG tablet, TAKE 1 Tablet BY MOUTH ONCE DAILY, Disp: 90 tablet, Rfl: 1   atorvastatin (LIPITOR) 20 MG tablet, TAKE 1 Tablet BY MOUTH ONCE DAILY, Disp: 90 tablet, Rfl: 1   metoprolol tartrate (LOPRESSOR) 50 MG tablet, TAKE 1 Tablet  BY MOUTH TWICE DAILY, Disp: 180 tablet, Rfl: 1   losartan (COZAAR) 100 MG tablet, Take 1 tablet (100 mg total) by mouth daily., Disp: 90 tablet, Rfl: 1    Review of Systems  Per HPI unless specifically indicated above     Objective:    BP (!) 165/89   Pulse 64   Temp 97.8 F (36.6 C)   SpO2 99%   Wt Readings from Last 3 Encounters:  05/07/22 181 lb (82.1 kg)  02/27/22 178 lb (80.7 kg)  01/29/22 180 lb (81.6 kg)    Physical Exam Vitals reviewed.  Constitutional:      General: She is not in acute distress.    Appearance: She is well-developed. She is not toxic-appearing.  HENT:     Head: Normocephalic and atraumatic.  Cardiovascular:     Rate and Rhythm: Normal rate and regular rhythm.  Pulmonary:     Effort: Pulmonary effort is normal.     Breath sounds: Normal breath sounds.  Abdominal:     General: Bowel sounds are normal.     Palpations:  Abdomen is soft. There is no mass.     Tenderness: There is no abdominal tenderness.  Musculoskeletal:     Cervical back: Neck supple.     Right lower leg: No edema.     Left lower leg: No edema.  Lymphadenopathy:     Cervical: No cervical adenopathy.  Skin:    General: Skin is warm and dry.  Neurological:     Mental Status: She is alert and oriented to person, place, and time.  Psychiatric:        Behavior: Behavior normal.           Assessment & Plan:    Encounter Diagnoses  Name Primary?   Primary hypertension Yes   Hyperlipidemia, unspecified hyperlipidemia type    Encounter for screening for malignant neoplasm of breast, unspecified screening modality      -pt to get fasting labs drawn -she is referred for screening Mammogram -her Colon cancer screening is UTD -pt to follow up 2 or 3 weeks to recheck bp and review labs.  She is to contact office sooner prn

## 2022-11-14 ENCOUNTER — Encounter: Payer: Self-pay | Admitting: Physician Assistant

## 2022-11-14 MED ORDER — LOSARTAN POTASSIUM 100 MG PO TABS: 100.0000 mg | ORAL_TABLET | Freq: Every day | ORAL | 1 refills | Status: DC

## 2022-11-18 ENCOUNTER — Other Ambulatory Visit (HOSPITAL_COMMUNITY)
Admission: RE | Admit: 2022-11-18 | Discharge: 2022-11-18 | Disposition: A | Discharge status: Home / Self Care | Payer: Self-pay | Source: Ambulatory Visit | Attending: Physician Assistant | Admitting: Physician Assistant

## 2022-11-18 DIAGNOSIS — E785 Hyperlipidemia, unspecified: Secondary | ICD-10-CM | POA: Insufficient documentation

## 2022-11-18 DIAGNOSIS — I10 Essential (primary) hypertension: Secondary | ICD-10-CM | POA: Insufficient documentation

## 2022-11-18 LAB — COMPREHENSIVE METABOLIC PANEL
ALT: 10 U/L (ref 0–44)
AST: 13 U/L — ABNORMAL LOW (ref 15–41)
Albumin: 3.8 g/dL (ref 3.5–5.0)
Alkaline Phosphatase: 53 U/L (ref 38–126)
Anion gap: 6 (ref 5–15)
BUN: 13 mg/dL (ref 8–23)
CO2: 24 mmol/L (ref 22–32)
Calcium: 9.3 mg/dL (ref 8.9–10.3)
Chloride: 108 mmol/L (ref 98–111)
Creatinine, Ser: 1.1 mg/dL — ABNORMAL HIGH (ref 0.44–1.00)
GFR, Estimated: 56 mL/min — ABNORMAL LOW (ref >60)
Glucose, Bld: 102 mg/dL — ABNORMAL HIGH (ref 70–99)
Potassium: 3.9 mmol/L (ref 3.5–5.1)
Sodium: 138 mmol/L (ref 135–145)
Total Bilirubin: 0.7 mg/dL (ref 0.3–1.2)
Total Protein: 7.9 g/dL (ref 6.5–8.1)

## 2022-11-18 LAB — LIPID PANEL
Cholesterol: 135 mg/dL (ref 0–200)
HDL: 46 mg/dL (ref >40)
LDL Cholesterol: 79 mg/dL (ref 0–99)
Total CHOL/HDL Ratio: 2.9 RATIO
Triglycerides: 48 mg/dL (ref <150)
VLDL: 10 mg/dL (ref 0–40)

## 2022-11-26 ENCOUNTER — Ambulatory Visit: Payer: Self-pay | Admitting: Physician Assistant

## 2022-12-05 ENCOUNTER — Encounter: Payer: Self-pay | Admitting: Physician Assistant

## 2022-12-05 ENCOUNTER — Ambulatory Visit: Payer: Self-pay | Admitting: Physician Assistant

## 2022-12-05 VITALS — BP 140/76 | HR 74 | Temp 96.8°F | Wt 181.1 lb

## 2022-12-05 DIAGNOSIS — F17200 Nicotine dependence, unspecified, uncomplicated: Secondary | ICD-10-CM

## 2022-12-05 DIAGNOSIS — I10 Essential (primary) hypertension: Secondary | ICD-10-CM

## 2022-12-05 DIAGNOSIS — Z2821 Immunization not carried out because of patient refusal: Secondary | ICD-10-CM

## 2022-12-05 DIAGNOSIS — E785 Hyperlipidemia, unspecified: Secondary | ICD-10-CM

## 2022-12-05 NOTE — Progress Notes (Signed)
BP (!) 140/76   Pulse 74   Temp (!) 96.8 F (36 C)   Wt 181 lb 1.6 oz (82.1 kg)   SpO2 97%   BMI 30.14 kg/m    Subjective:    Patient ID: Whitney Bird, female    DOB: [birth year], 62 y.o.   MRN: [id number]  HPI: Whitney Bird is a 61 y.o. female presenting on 12/05/2022 for Hypertension   HPI   Chief Complaint  Patient presents with   Hypertension    Pt says she is doing well.   She feels good and has no complaints.   She has been checking her BP at home and has log sheet.  The readings are good.   Relevant past medical, surgical, family and social history reviewed and updated as indicated. Interim medical history since our last visit reviewed. Allergies and medications reviewed and updated.    Current Outpatient Medications:    amLODipine (NORVASC) 10 MG tablet, TAKE 1 Tablet BY MOUTH ONCE DAILY, Disp: 90 tablet, Rfl: 1   atorvastatin (LIPITOR) 20 MG tablet, TAKE 1 Tablet BY MOUTH ONCE DAILY, Disp: 90 tablet, Rfl: 1   losartan (COZAAR) 100 MG tablet, Take 1 tablet (100 mg total) by mouth daily., Disp: 90 tablet, Rfl: 1   metoprolol tartrate (LOPRESSOR) 50 MG tablet, TAKE 1 Tablet  BY MOUTH TWICE DAILY, Disp: 180 tablet, Rfl: 1    Review of Systems  Per HPI unless specifically indicated above     Objective:    BP (!) 140/76   Pulse 74   Temp (!) 96.8 F (36 C)   Wt 181 lb 1.6 oz (82.1 kg)   SpO2 97%   BMI 30.14 kg/m   Wt Readings from Last 3 Encounters:  12/05/22 181 lb 1.6 oz (82.1 kg)  05/07/22 181 lb (82.1 kg)  02/27/22 178 lb (80.7 kg)    Physical Exam Vitals reviewed.  Constitutional:      General: She is not in acute distress.    Appearance: She is well-developed.  HENT:     Head: Normocephalic and atraumatic.     Right Ear: Tympanic membrane, ear canal and external ear normal.     Left Ear: Tympanic membrane, ear canal and external ear normal.  Cardiovascular:     Rate and Rhythm: Normal rate and regular rhythm.   Pulmonary:     Effort: Pulmonary effort is normal.     Breath sounds: Normal breath sounds.  Abdominal:     General: Bowel sounds are normal.     Palpations: Abdomen is soft. There is no mass.     Tenderness: There is no abdominal tenderness.  Musculoskeletal:     Cervical back: Neck supple.     Right lower leg: No edema.     Left lower leg: No edema.  Lymphadenopathy:     Cervical: No cervical adenopathy.  Skin:    General: Skin is warm and dry.  Neurological:     Mental Status: She is alert and oriented to person, place, and time.  Psychiatric:        Attention and Perception: Attention normal.        Speech: Speech normal.        Behavior: Behavior normal. Behavior is cooperative.     Results for orders placed or performed during the hospital encounter of 11/18/22  Lipid panel  Result Value Ref Range   Cholesterol 135 0 - 200 mg/dL   Triglycerides 48 <003 mg/dL  HDL 46 >40 mg/dL   Total CHOL/HDL Ratio 2.9 RATIO   VLDL 10 0 - 40 mg/dL   LDL Cholesterol 79 0 - 99 mg/dL  Comprehensive metabolic panel  Result Value Ref Range   Sodium 138 135 - 145 mmol/L   Potassium 3.9 3.5 - 5.1 mmol/L   Chloride 108 98 - 111 mmol/L   CO2 24 22 - 32 mmol/L   Glucose, Bld 102 (H) 70 - 99 mg/dL   BUN 13 8 - 23 mg/dL   Creatinine, Ser 8.27 (H) 0.44 - 1.00 mg/dL   Calcium 9.3 8.9 - 07.8 mg/dL   Total Protein 7.9 6.5 - 8.1 g/dL   Albumin 3.8 3.5 - 5.0 g/dL   AST 13 (L) 15 - 41 U/L   ALT 10 0 - 44 U/L   Alkaline Phosphatase 53 38 - 126 U/L   Total Bilirubin 0.7 0.3 - 1.2 mg/dL   GFR, Estimated 56 (L) >60 mL/min   Anion gap 6 5 - 15      Assessment & Plan:    Encounter Diagnoses  Name Primary?   Primary hypertension Yes   Hyperlipidemia, unspecified hyperlipidemia type    White coat syndrome with diagnosis of hypertension    Tobacco use disorder    Influenza vaccination declined       -reviewed labs with pt -pt to continue her current medications -pt was encouraged to  continue to check bp once weekly or so.  She was encouraged to contact office if it starts running 140 or higher.   -will continue to monitor renal function -pt was encouraged to increase her walks from 2 days/week to 4 or 5 days/week -Call to check on mammogram referral -pt Declined flu shot -encouraged smoking cessation -pt to follow up 3 months.  She is to contact office sooner prn

## 2022-12-10 ENCOUNTER — Other Ambulatory Visit: Payer: Self-pay | Admitting: Physician Assistant

## 2022-12-10 DIAGNOSIS — Z1231 Encounter for screening mammogram for malignant neoplasm of breast: Secondary | ICD-10-CM

## 2022-12-23 ENCOUNTER — Ambulatory Visit (HOSPITAL_COMMUNITY)
Admission: RE | Admit: 2022-12-23 | Discharge: 2022-12-23 | Disposition: A | Discharge status: Home / Self Care | Payer: Self-pay | Source: Ambulatory Visit | Attending: Physician Assistant | Admitting: Physician Assistant

## 2022-12-23 DIAGNOSIS — Z1231 Encounter for screening mammogram for malignant neoplasm of breast: Secondary | ICD-10-CM | POA: Insufficient documentation

## 2023-02-03 ENCOUNTER — Other Ambulatory Visit: Payer: Self-pay | Admitting: Physician Assistant

## 2023-02-03 DIAGNOSIS — R7989 Other specified abnormal findings of blood chemistry: Secondary | ICD-10-CM

## 2023-02-03 DIAGNOSIS — E785 Hyperlipidemia, unspecified: Secondary | ICD-10-CM

## 2023-02-03 DIAGNOSIS — D649 Anemia, unspecified: Secondary | ICD-10-CM

## 2023-02-03 DIAGNOSIS — R7309 Other abnormal glucose: Secondary | ICD-10-CM

## 2023-02-03 DIAGNOSIS — I10 Essential (primary) hypertension: Secondary | ICD-10-CM

## 2023-02-03 DIAGNOSIS — Z131 Encounter for screening for diabetes mellitus: Secondary | ICD-10-CM

## 2023-02-11 ENCOUNTER — Other Ambulatory Visit: Payer: Self-pay | Admitting: Physician Assistant

## 2023-03-05 ENCOUNTER — Ambulatory Visit: Payer: Self-pay | Admitting: Physician Assistant

## 2023-03-05 ENCOUNTER — Encounter: Payer: Self-pay | Admitting: Physician Assistant

## 2023-03-05 VITALS — BP 143/80 | HR 58 | Temp 97.5°F | Wt 184.0 lb

## 2023-03-05 DIAGNOSIS — F17200 Nicotine dependence, unspecified, uncomplicated: Secondary | ICD-10-CM

## 2023-03-05 DIAGNOSIS — I10 Essential (primary) hypertension: Secondary | ICD-10-CM

## 2023-03-05 DIAGNOSIS — Z5320 Procedure and treatment not carried out because of patient's decision for unspecified reasons: Secondary | ICD-10-CM

## 2023-03-05 DIAGNOSIS — E785 Hyperlipidemia, unspecified: Secondary | ICD-10-CM

## 2023-03-05 NOTE — Progress Notes (Signed)
BP (!) 143/80   Pulse (!) 58   Temp (!) 97.5 F (36.4 C)   Wt 184 lb (83.5 kg)   SpO2 100%   BMI 30.62 kg/m    Subjective:    Patient ID: Whitney Bird, female    DOB: Feb 14, 1961, 62 y.o.   MRN: [id number]  HPI: [name] is a 62 y.o. female presenting on 03/05/2023 for Hypertension and Hyperlipidemia   HPI   Chief Complaint  Patient presents with   Hypertension   Hyperlipidemia     She checks her bp at home.  This morning her bp was 123/74  which is typical for her.   She is walking regularly.   She denies sob, cp.  She says she has no complaints today.    She hasn't gotten her labs drawn yet.     Relevant past medical, surgical, family and social history reviewed and updated as indicated. Interim medical history since our last visit reviewed. Allergies and medications reviewed and updated.    Current Outpatient Medications:    amLODipine (NORVASC) 10 MG tablet, TAKE 1 Tablet BY MOUTH ONCE DAILY, Disp: 90 tablet, Rfl: 1   atorvastatin (LIPITOR) 20 MG tablet, TAKE 1 Tablet BY MOUTH ONCE DAILY, Disp: 90 tablet, Rfl: 1   losartan (COZAAR) 100 MG tablet, TAKE 1 Tablet BY MOUTH ONCE EVERY DAY, Disp: 90 tablet, Rfl: 1   metoprolol tartrate (LOPRESSOR) 50 MG tablet, TAKE 1 Tablet  BY MOUTH TWICE DAILY, Disp: 180 tablet, Rfl: 1   Review of Systems  Per HPI unless specifically indicated above     Objective:    BP (!) 143/80   Pulse (!) 58   Temp (!) 97.5 F (36.4 C)   Wt 184 lb (83.5 kg)   SpO2 100%   BMI 30.62 kg/m   Wt Readings from Last 3 Encounters:  03/05/23 184 lb (83.5 kg)  12/05/22 181 lb 1.6 oz (82.1 kg)  05/07/22 181 lb (82.1 kg)    Physical Exam Vitals reviewed.  Constitutional:      Appearance: She is well-developed.  HENT:     Head: Normocephalic and atraumatic.     Right Ear: Tympanic membrane, ear canal and external ear normal.     Left Ear: Tympanic membrane, ear canal and external ear normal.  Eyes:     Extraocular  Movements: Extraocular movements intact.     Conjunctiva/sclera: Conjunctivae normal.     Pupils: Pupils are equal, round, and reactive to light.  Cardiovascular:     Rate and Rhythm: Normal rate and regular rhythm.  Pulmonary:     Effort: Pulmonary effort is normal.     Breath sounds: Normal breath sounds.  Abdominal:     General: Bowel sounds are normal.     Palpations: Abdomen is soft. There is no mass.     Tenderness: There is no abdominal tenderness.  Musculoskeletal:     Cervical back: Neck supple.  Lymphadenopathy:     Cervical: No cervical adenopathy.  Skin:    General: Skin is warm and dry.  Neurological:     Mental Status: She is alert and oriented to person, place, and time.  Psychiatric:        Behavior: Behavior normal.           Assessment & Plan:   Encounter Diagnoses  Name Primary?   Primary hypertension Yes   Hyperlipidemia, unspecified hyperlipidemia type    White coat syndrome with diagnosis of hypertension  Tobacco use disorder    Pap smear of cervix declined      -pt referred for CT chest- screening lung cancer -Pt declined screening PAP -pt is given application for [employer] financial assistance -Pt encouraged to get her fasting labs drawn.  She will be called with results -encouraged smoking cessation -pt to continue current medications -pt to follow up 3 months. She is to contact office sooner prn

## 2023-03-05 NOTE — Patient Instructions (Signed)
-  get fasting labs/bloodwork -turn in [employer] assistance application

## 2023-04-14 ENCOUNTER — Other Ambulatory Visit (HOSPITAL_COMMUNITY)
Admission: RE | Admit: 2023-04-14 | Discharge: 2023-04-14 | Disposition: A | Discharge status: Home / Self Care | Payer: Self-pay | Source: Ambulatory Visit | Attending: Physician Assistant | Admitting: Physician Assistant

## 2023-04-14 DIAGNOSIS — I10 Essential (primary) hypertension: Secondary | ICD-10-CM | POA: Insufficient documentation

## 2023-04-14 DIAGNOSIS — E785 Hyperlipidemia, unspecified: Secondary | ICD-10-CM | POA: Insufficient documentation

## 2023-04-14 DIAGNOSIS — Z131 Encounter for screening for diabetes mellitus: Secondary | ICD-10-CM | POA: Insufficient documentation

## 2023-04-14 DIAGNOSIS — D649 Anemia, unspecified: Secondary | ICD-10-CM | POA: Insufficient documentation

## 2023-04-14 DIAGNOSIS — R7989 Other specified abnormal findings of blood chemistry: Secondary | ICD-10-CM | POA: Insufficient documentation

## 2023-04-14 DIAGNOSIS — R7309 Other abnormal glucose: Secondary | ICD-10-CM | POA: Insufficient documentation

## 2023-04-14 LAB — COMPREHENSIVE METABOLIC PANEL
ALT: 8 U/L (ref 0–44)
AST: 13 U/L — ABNORMAL LOW (ref 15–41)
Albumin: 3.9 g/dL (ref 3.5–5.0)
Alkaline Phosphatase: 53 U/L (ref 38–126)
Anion gap: 8 (ref 5–15)
BUN: 9 mg/dL (ref 8–23)
CO2: 23 mmol/L (ref 22–32)
Calcium: 9.2 mg/dL (ref 8.9–10.3)
Chloride: 107 mmol/L (ref 98–111)
Creatinine, Ser: 0.97 mg/dL (ref 0.44–1.00)
GFR, Estimated: >60 mL/min (ref >60)
Glucose, Bld: 96 mg/dL (ref 70–99)
Potassium: 3.6 mmol/L (ref 3.5–5.1)
Sodium: 138 mmol/L (ref 135–145)
Total Bilirubin: 0.8 mg/dL (ref 0.3–1.2)
Total Protein: 7.7 g/dL (ref 6.5–8.1)

## 2023-04-14 LAB — CBC
HCT: 34.9 % — ABNORMAL LOW (ref 36.0–46.0)
Hemoglobin: 11.6 g/dL — ABNORMAL LOW (ref 12.0–15.0)
MCH: 29.3 pg (ref 26.0–34.0)
MCHC: 33.2 g/dL (ref 30.0–36.0)
MCV: 88.1 fL (ref 80.0–100.0)
Platelets: 360 10*3/uL (ref 150–400)
RBC: 3.96 MIL/uL (ref 3.87–5.11)
RDW: 13.4 % (ref 11.5–15.5)
WBC: 8 10*3/uL (ref 4.0–10.5)
nRBC: 0 % (ref 0.0–0.2)

## 2023-04-14 LAB — LIPID PANEL
Cholesterol: 130 mg/dL (ref 0–200)
HDL: 38 mg/dL — ABNORMAL LOW (ref >40)
LDL Cholesterol: 79 mg/dL (ref 0–99)
Total CHOL/HDL Ratio: 3.4 RATIO
Triglycerides: 63 mg/dL (ref <150)
VLDL: 13 mg/dL (ref 0–40)

## 2023-04-14 LAB — HEMOGLOBIN A1C
Hgb A1c MFr Bld: 5.3 % (ref 4.8–5.6)
Mean Plasma Glucose: 105.41 mg/dL

## 2023-04-22 IMAGING — MG MM BREAST LOCALIZATION CLIP
4 series · 4 of 12 positions shown · non-contrast
Comparison: Previous exam(s).

CLINICAL DATA: Status post stereotactic biopsy for 2 groups of
indeterminate calcifications in the upper-outer quadrant the RIGHT
breast.

EXAM:
3D DIAGNOSTIC RIGHT MAMMOGRAM POST STEREOTACTIC BIOPSY x2

[R ML synth-2D]
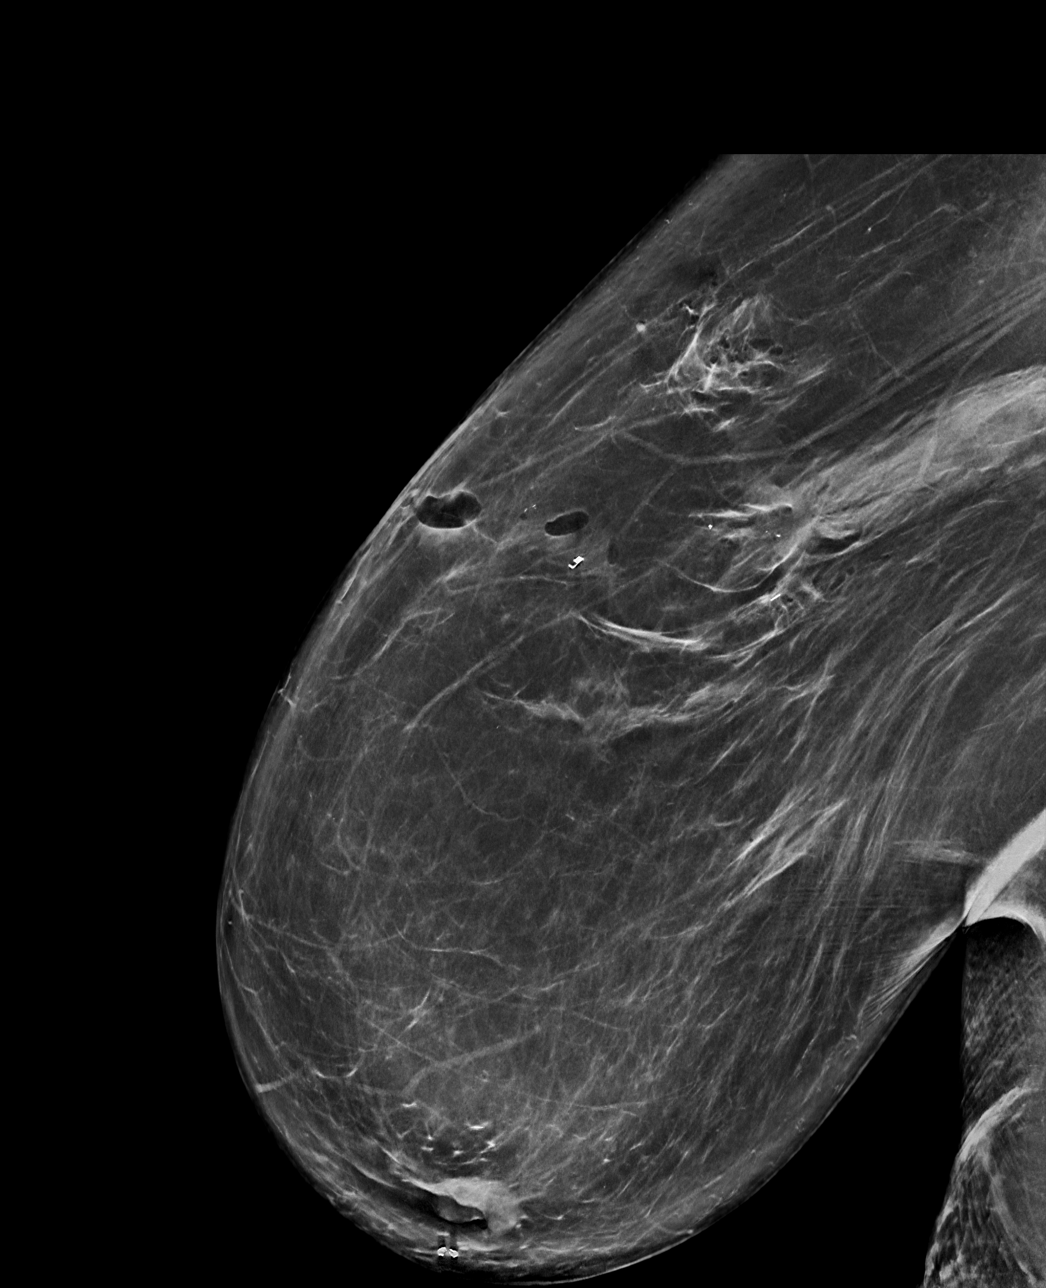

[R CC synth-2D]
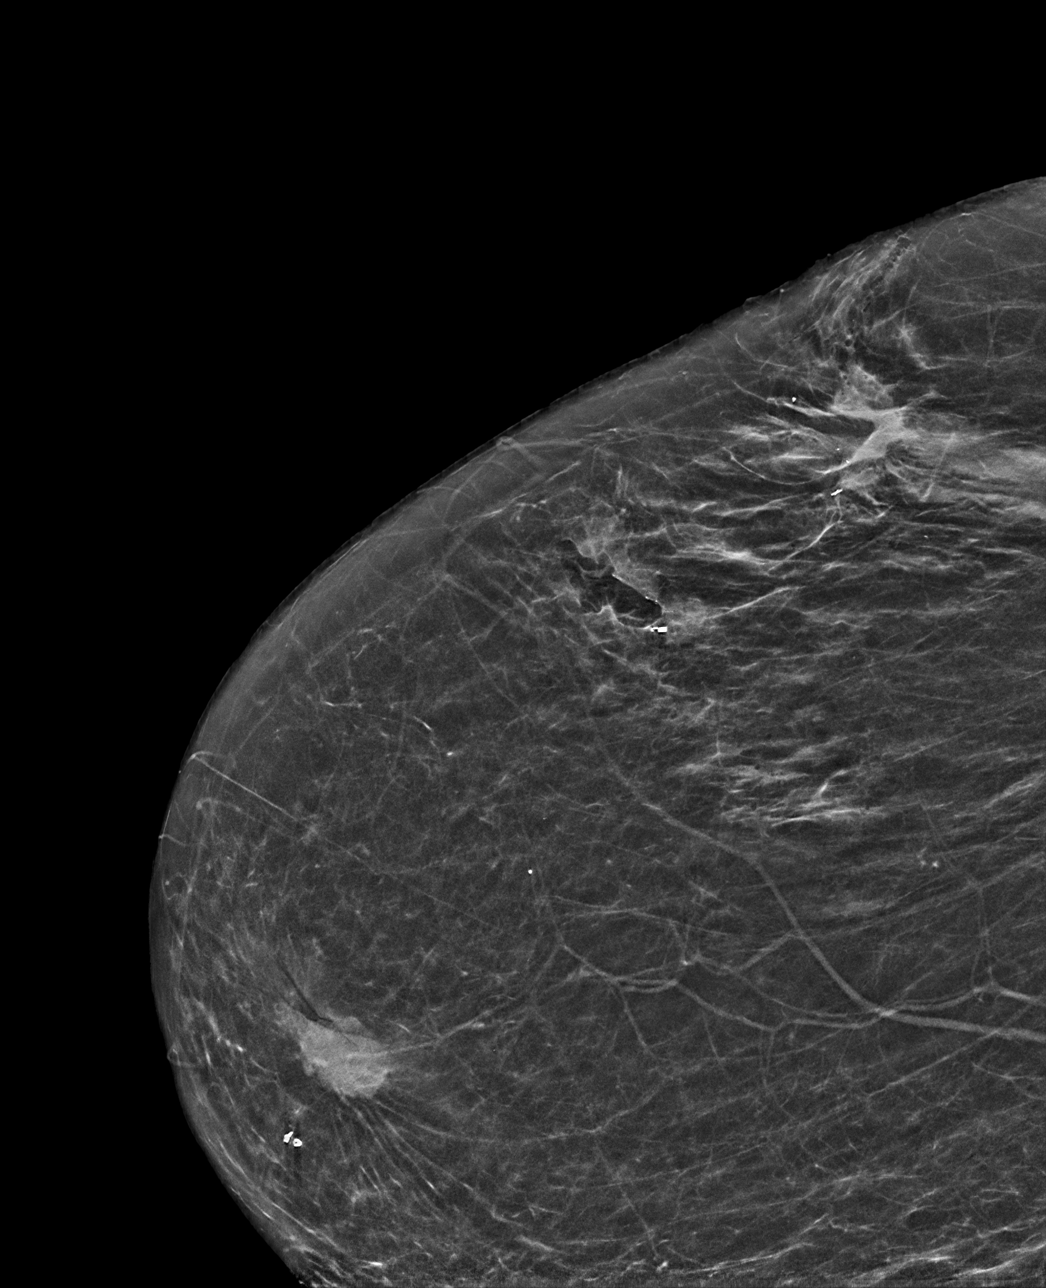

[R CC tomo · tomo slice 29/57.0]
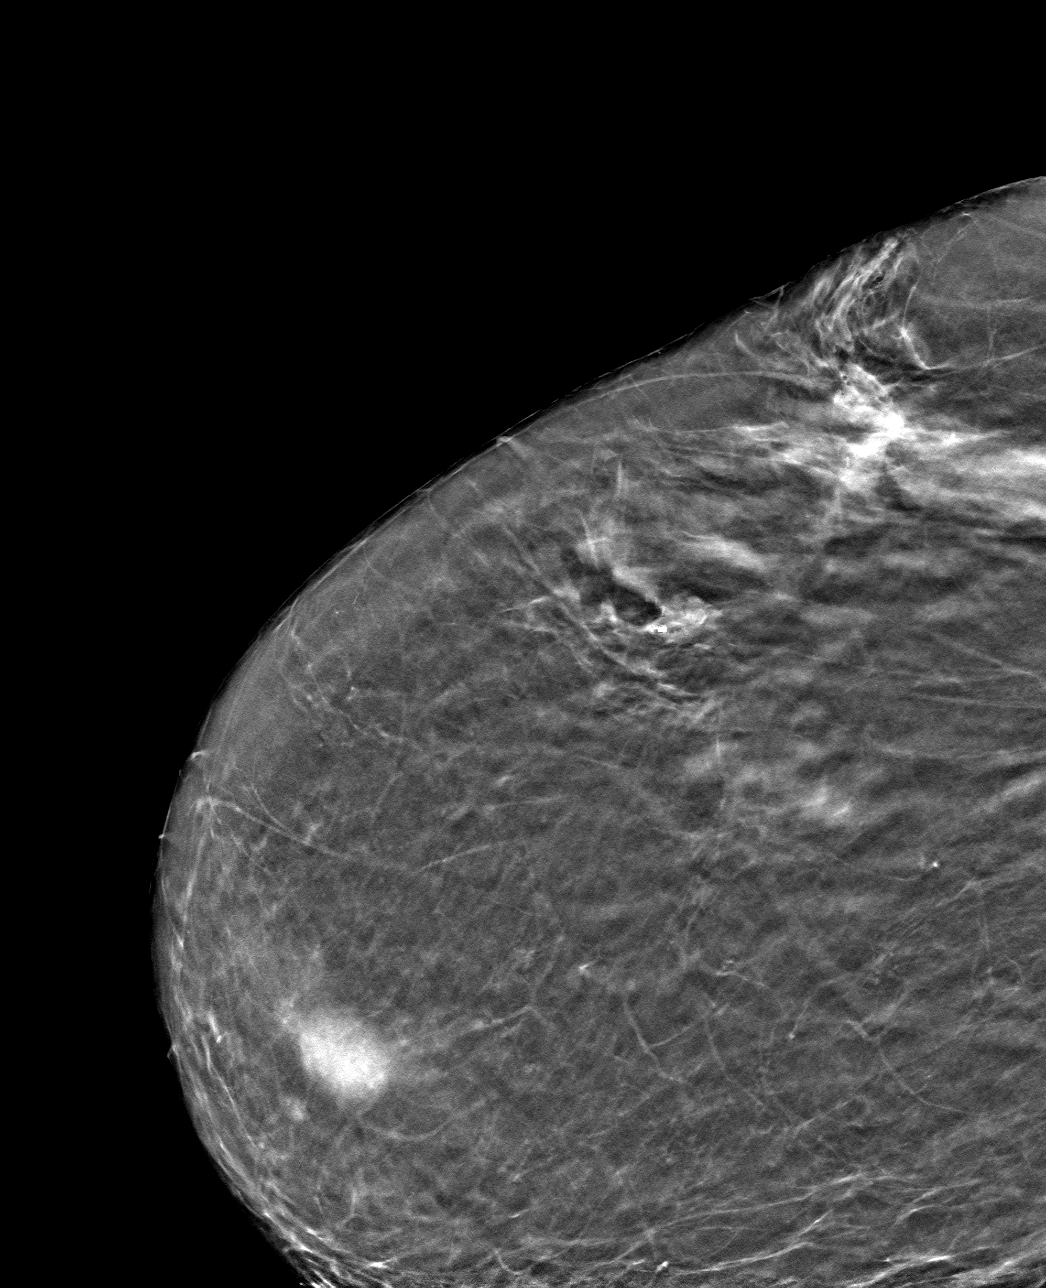

[R ML tomo · tomo slice 39/78.0]
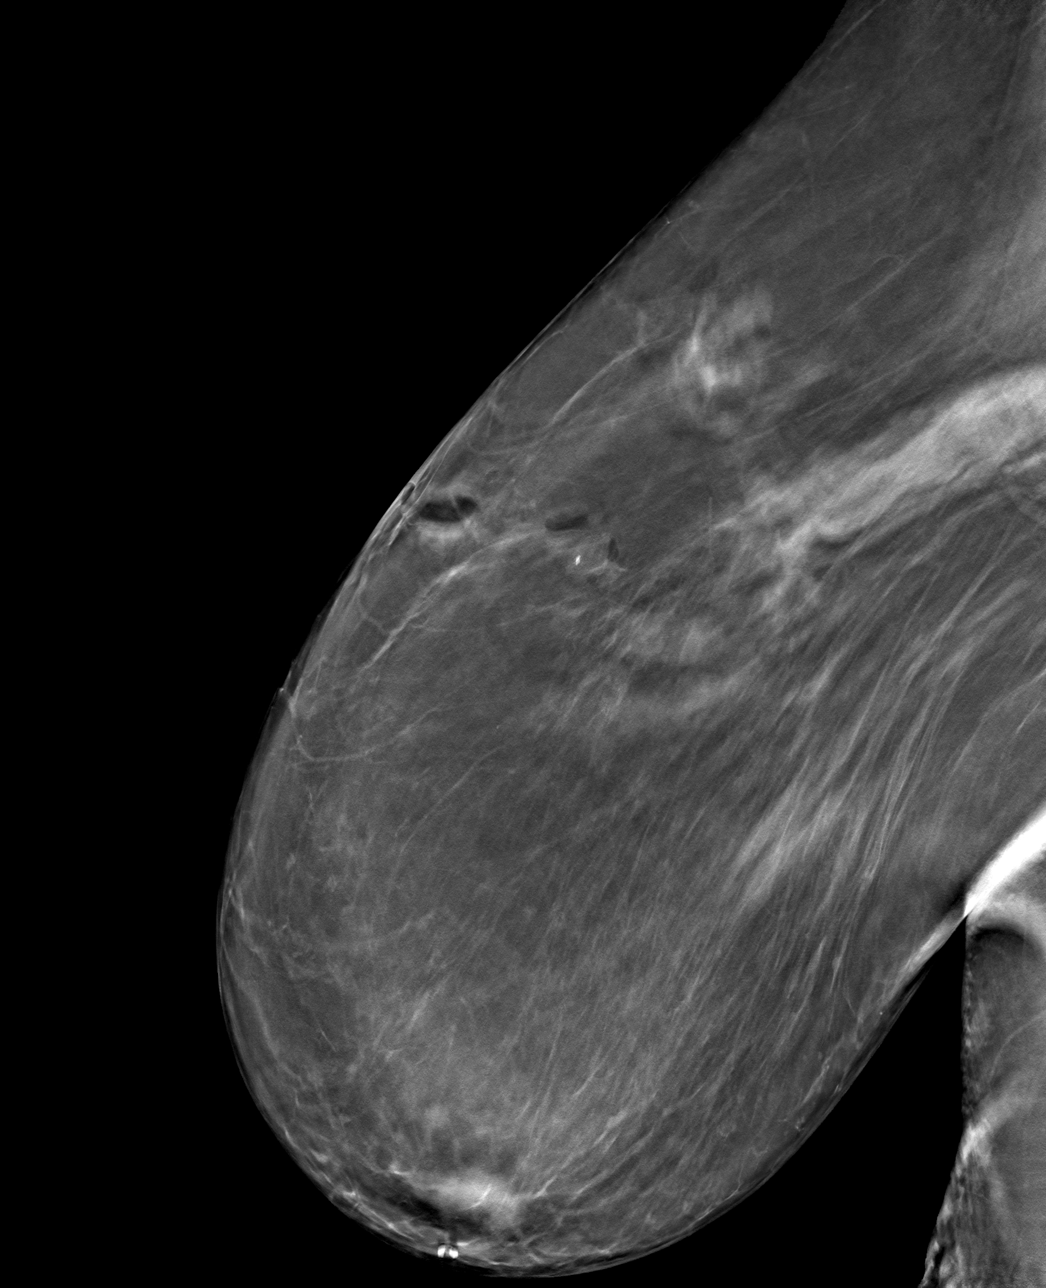

[4 of 12 positions shown; findings below may reference images not displayed]

FINDINGS: 3D Mammographic images were obtained following stereotactic guided
biopsy of indeterminate calcifications within the upper-outer
quadrant the RIGHT breast (2 sites), at posterior depth and middle
depth respectively. The biopsy marking clips are in expected
positions at the sites of biopsy.
IMPRESSION: 1. Appropriate positioning of the X shaped biopsy marking clip at
the site of biopsy in the upper-outer quadrant of the RIGHT breast
at POSTERIOR depth.
2. Appropriate positioning of the coil shaped biopsy marking clip at
the site of biopsy in the upper-outer quadrant of the RIGHT breast
at MIDDLE depth.

Final Assessment: Post Procedure Mammograms for Marker Placement

## 2023-04-22 IMAGING — MG MM BREAST BX W/ LOC DEV EA AD LESION IMAG BX SPEC STEREO GUIDE*R
7 of 10 series · 7 of 22 positions shown · non-contrast
Comparison: Previous exams.
COMPARISON: Previous exams.

Addendum:
CLINICAL DATA: Patient with 2 groups of indeterminate
calcifications within the upper-outer quadrant the RIGHT breast
presents today for stereotactic biopsies of both groups.

EXAM:
RIGHT BREAST STEREOTACTIC CORE NEEDLE BIOPSY x2

[R (1 of 7)]
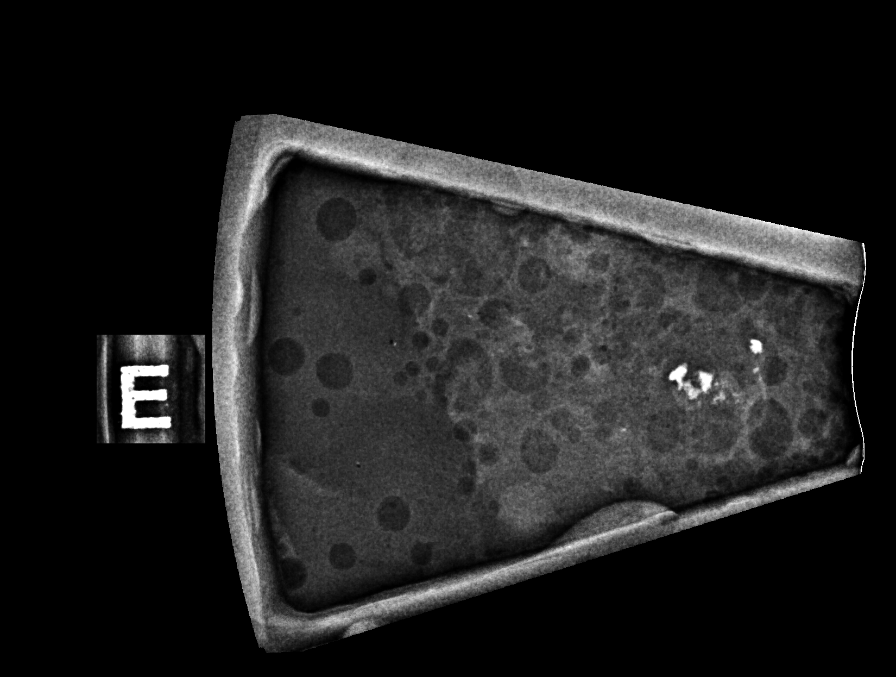

[R (2 of 7)]
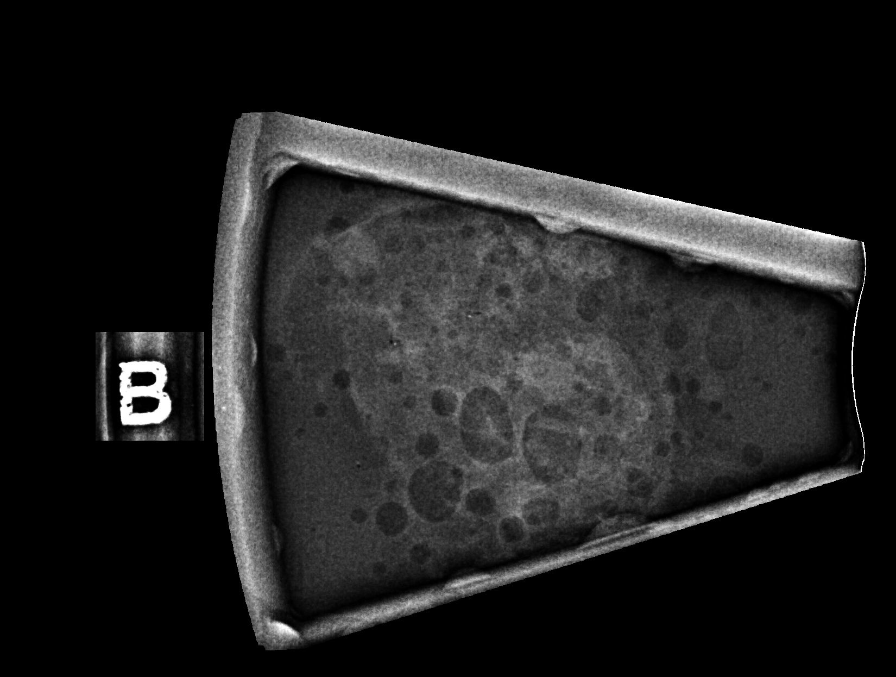

[R (3 of 7)]
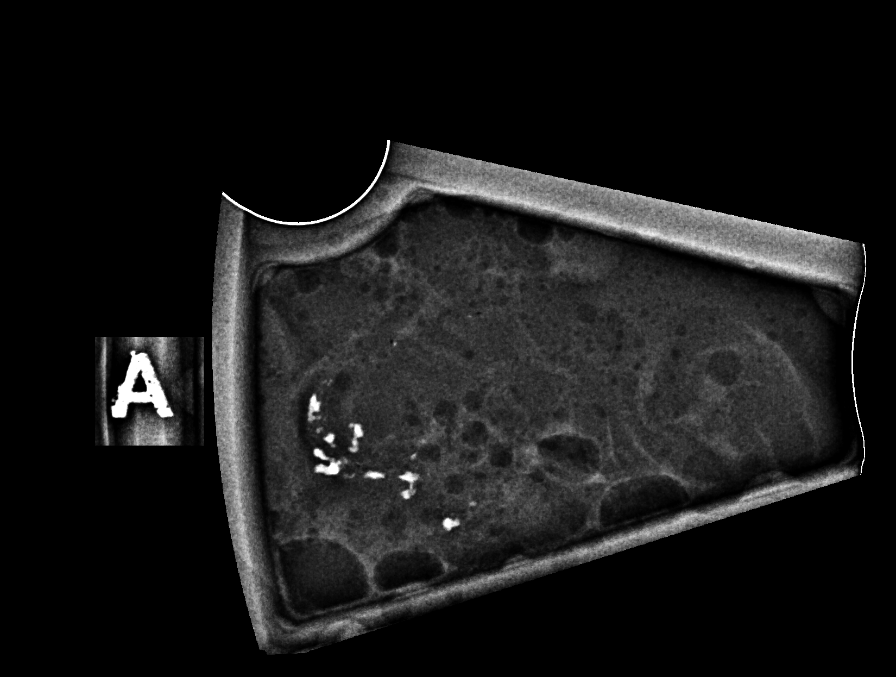

[R (4 of 7)]
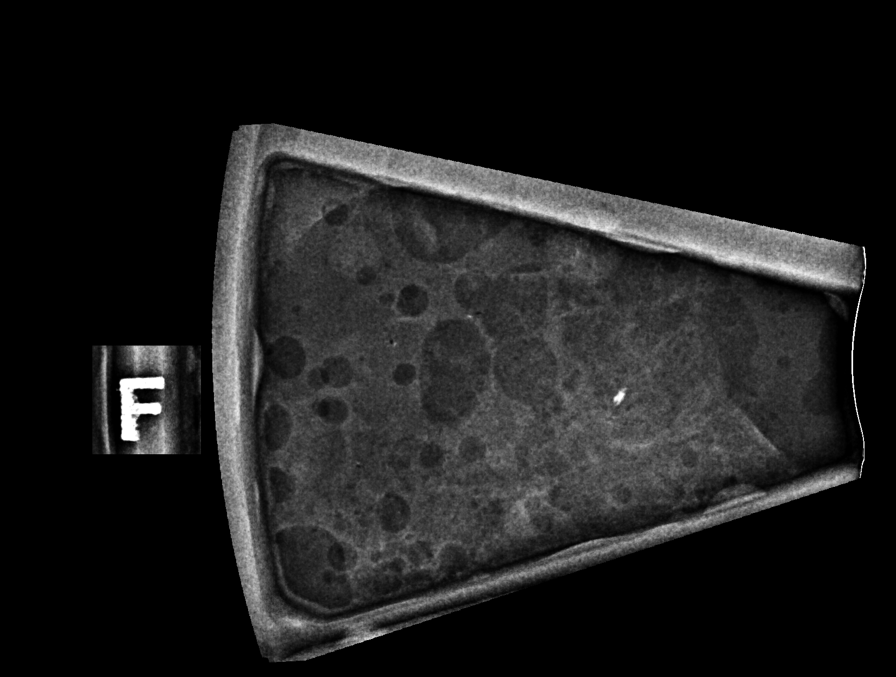

[R (5 of 7)]
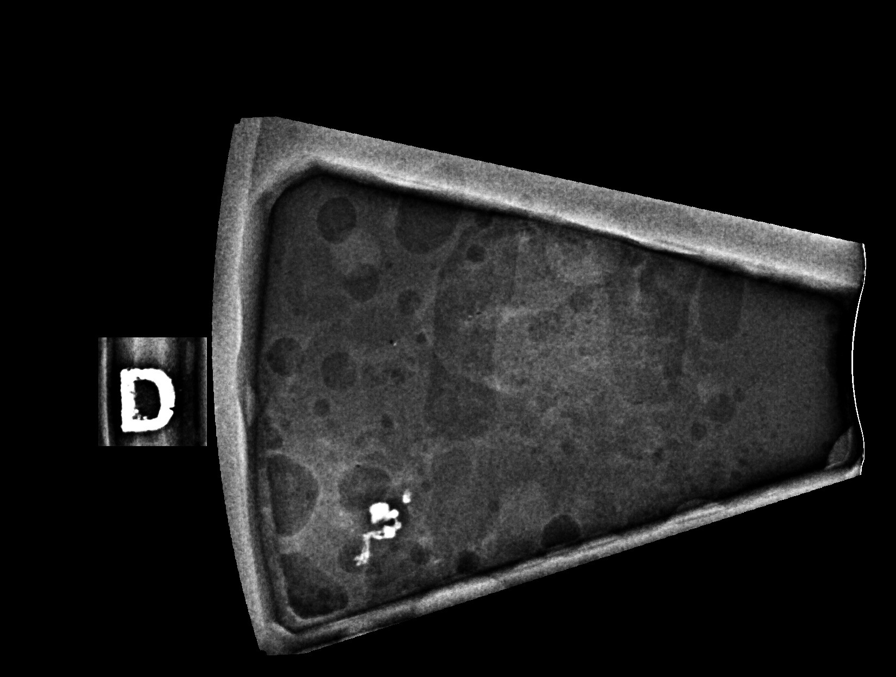

[R (6 of 7)]
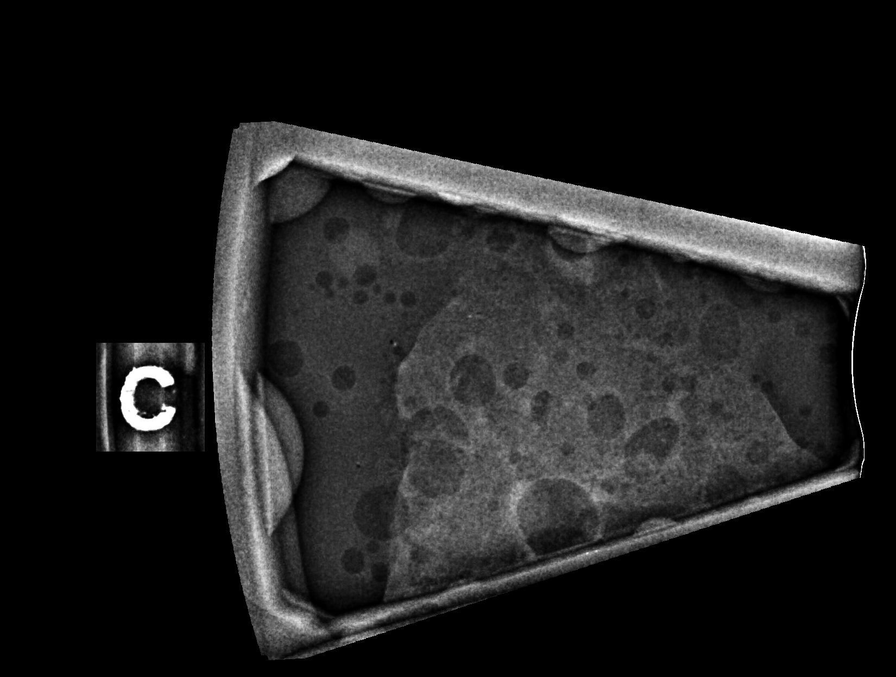

[R (7 of 7)]
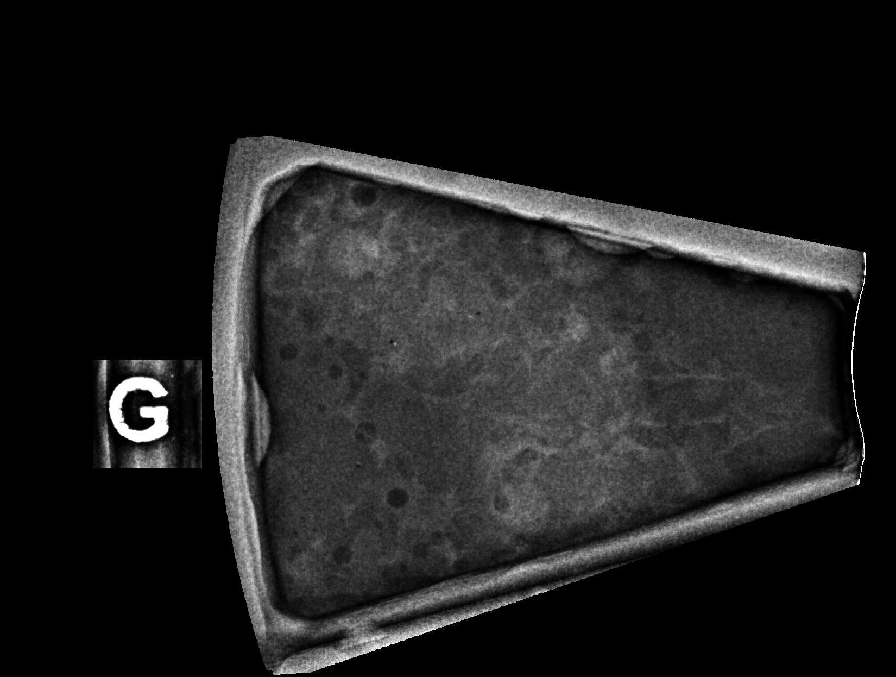

[7 of 22 positions shown; findings below may reference images not displayed]



Site 1:

Using sterile technique and 1% Lidocaine as local anesthetic, under
stereotactic guidance, a 9 gauge vacuum assisted device was used to
perform core needle biopsy of calcifications in the upper outer
quadrant of the RIGHT breast at posterior depth using a superior
approach. Specimen radiograph was performed showing calcifications.
Specimens with calcifications are identified for pathology.

Lesion quadrant: Upper outer quadrant

At the conclusion of the procedure, X shaped tissue marker clip was
deployed into the biopsy cavity.

Site 2:

Using sterile technique and 1% Lidocaine as local anesthetic, under
stereotactic guidance, a 9 gauge vacuum assisted device was used to
perform core needle biopsy of calcifications in the upper outer
quadrant of the RIGHT breast at middle depth using a superior
approach. Specimen radiograph was performed showing . Specimens with
calcifications are identified for pathology.

Lesion quadrant: Upper outer quadrant

At the conclusion of the procedure, tissue marker clip was deployed
into the biopsy cavity.

Follow-up 2-view mammogram was performed and dictated separately.
IMPRESSION: 1. Stereotactic-guided biopsy of indeterminate calcifications within
the upper-outer quadrant of the RIGHT breast at POSTERIOR depth. No
apparent complications.
2. Stereotactic-guided biopsy of indeterminate calcifications within
the upper-outer quadrant of the RIGHT breast at MIDDLE depth. No
apparent complications.

ADDENDUM:
Pathology revealed FIBROADENOMATOID NODULE WITH CALCIFICATIONS of
the RIGHT breast, upper outer quadrant, posterior depth, X clip.
This was found to be concordant by Dr. Shalley Jim.

Pathology revealed FIBROADENOMATOID CHANGE WITH CALCIFICATIONS of
the RIGHT breast, upper outer quadrant, middle depth, coil clip.
This was found to be concordant by Dr. Shalley Jim.

Pathology results were discussed with the patient by telephone. The
patient reported doing well after the biopsies with tenderness at
the sites. Post biopsy instructions and care were reviewed and
questions were answered. The patient was encouraged to call The

The patient was instructed to return for annual screening
mammography and informed a reminder notice would be sent regarding
this appointment.

Pathology results reported by Chulieta Geravand RN on 08/03/2021.



Site 1:

Using sterile technique and 1% Lidocaine as local anesthetic, under
stereotactic guidance, a 9 gauge vacuum assisted device was used to
perform core needle biopsy of calcifications in the upper outer
quadrant of the RIGHT breast at posterior depth using a superior
approach. Specimen radiograph was performed showing calcifications.
Specimens with calcifications are identified for pathology.

Lesion quadrant: Upper outer quadrant

At the conclusion of the procedure, X shaped tissue marker clip was
deployed into the biopsy cavity.

Site 2:

Using sterile technique and 1% Lidocaine as local anesthetic, under
stereotactic guidance, a 9 gauge vacuum assisted device was used to
perform core needle biopsy of calcifications in the upper outer
quadrant of the RIGHT breast at middle depth using a superior
approach. Specimen radiograph was performed showing . Specimens with
calcifications are identified for pathology.

Lesion quadrant: Upper outer quadrant

At the conclusion of the procedure, tissue marker clip was deployed
into the biopsy cavity.

Follow-up 2-view mammogram was performed and dictated separately.
IMPRESSION: 1. Stereotactic-guided biopsy of indeterminate calcifications within
the upper-outer quadrant of the RIGHT breast at POSTERIOR depth. No
apparent complications.
2. Stereotactic-guided biopsy of indeterminate calcifications within
the upper-outer quadrant of the RIGHT breast at MIDDLE depth. No
apparent complications.

## 2023-05-07 ENCOUNTER — Observation Stay (HOSPITAL_COMMUNITY): Payer: Self-pay

## 2023-06-04 ENCOUNTER — Encounter: Payer: Self-pay | Admitting: Physician Assistant

## 2023-06-04 ENCOUNTER — Ambulatory Visit: Payer: Self-pay | Admitting: Physician Assistant

## 2023-06-04 VITALS — BP 144/78 | HR 83 | Temp 98.1°F | Wt 176.5 lb

## 2023-06-04 DIAGNOSIS — I10 Essential (primary) hypertension: Secondary | ICD-10-CM

## 2023-06-04 DIAGNOSIS — F17200 Nicotine dependence, unspecified, uncomplicated: Secondary | ICD-10-CM

## 2023-06-04 DIAGNOSIS — E785 Hyperlipidemia, unspecified: Secondary | ICD-10-CM

## 2023-06-04 NOTE — Progress Notes (Signed)
BP (!) 144/78   Pulse 83   Temp 98.1 F (36.7 C)   Wt 176 lb 8 oz (80.1 kg)   SpO2 98%   BMI 29.37 kg/m    Subjective:    Patient ID: Whitney Bird, female    DOB: [birth year], 62 y.o.   MRN: [id number]  HPI: Whitney Bird is a 62 y.o. female presenting on 06/04/2023 for Hypertension (Pt states she has been taking her medications regularly. Pt mentions being stressed due to personal issues.) and Hyperlipidemia   HPI  Chief Complaint  Patient presents with   Hypertension    Pt states she has been taking her medications regularly. Pt mentions being stressed due to personal issues.   Hyperlipidemia    Pt admits stress as above but declines to go into it further.  She has no SI, HI.     Relevant past medical, surgical, family and social history reviewed and updated as indicated. Interim medical history since our last visit reviewed. Allergies and medications reviewed and updated.   Current Outpatient Medications:    amLODipine (NORVASC) 10 MG tablet, TAKE 1 Tablet BY MOUTH ONCE DAILY, Disp: 90 tablet, Rfl: 1   atorvastatin (LIPITOR) 20 MG tablet, TAKE 1 Tablet BY MOUTH ONCE DAILY, Disp: 90 tablet, Rfl: 1   losartan (COZAAR) 100 MG tablet, TAKE 1 Tablet BY MOUTH ONCE EVERY DAY, Disp: 90 tablet, Rfl: 1   metoprolol tartrate (LOPRESSOR) 50 MG tablet, TAKE 1 Tablet  BY MOUTH TWICE DAILY, Disp: 180 tablet, Rfl: 1    Review of Systems  Per HPI unless specifically indicated above     Objective:    BP (!) 144/78   Pulse 83   Temp 98.1 F (36.7 C)   Wt 176 lb 8 oz (80.1 kg)   SpO2 98%   BMI 29.37 kg/m   Wt Readings from Last 3 Encounters:  06/04/23 176 lb 8 oz (80.1 kg)  03/05/23 184 lb (83.5 kg)  12/05/22 181 lb 1.6 oz (82.1 kg)    Physical Exam Vitals reviewed.  Constitutional:      General: She is not in acute distress.    Appearance: She is well-developed. She is not toxic-appearing.  HENT:     Head: Normocephalic and atraumatic.   Cardiovascular:     Rate and Rhythm: Normal rate and regular rhythm.  Pulmonary:     Effort: Pulmonary effort is normal.     Breath sounds: Normal breath sounds.  Abdominal:     General: Bowel sounds are normal.     Palpations: Abdomen is soft. There is no mass.     Tenderness: There is no abdominal tenderness.  Musculoskeletal:     Cervical back: Neck supple.     Right lower leg: No edema.     Left lower leg: No edema.  Lymphadenopathy:     Cervical: No cervical adenopathy.  Skin:    General: Skin is warm and dry.  Neurological:     Mental Status: She is alert and oriented to person, place, and time.  Psychiatric:        Attention and Perception: Attention normal.        Speech: Speech normal.        Behavior: Behavior normal. Behavior is cooperative.     Results for orders placed or performed during the hospital encounter of 04/14/23  Hemoglobin A1c  Result Value Ref Range   Hgb A1c MFr Bld 5.3 4.8 - 5.6 %   Mean Plasma  Glucose 105.41 mg/dL  CBC  Result Value Ref Range   WBC 8.0 4.0 - 10.5 K/uL   RBC 3.96 3.87 - 5.11 MIL/uL   Hemoglobin 11.6 (L) 12.0 - 15.0 g/dL   HCT 16.1 (L) 09.6 - 04.5 %   MCV 88.1 80.0 - 100.0 fL   MCH 29.3 26.0 - 34.0 pg   MCHC 33.2 30.0 - 36.0 g/dL   RDW 40.9 81.1 - 91.4 %   Platelets 360 150 - 400 K/uL   nRBC 0.0 0.0 - 0.2 %  Lipid panel  Result Value Ref Range   Cholesterol 130 0 - 200 mg/dL   Triglycerides 63 <782 mg/dL   HDL 38 (L) >95 mg/dL   Total CHOL/HDL Ratio 3.4 RATIO   VLDL 13 0 - 40 mg/dL   LDL Cholesterol 79 0 - 99 mg/dL  Comprehensive metabolic panel  Result Value Ref Range   Sodium 138 135 - 145 mmol/L   Potassium 3.6 3.5 - 5.1 mmol/L   Chloride 107 98 - 111 mmol/L   CO2 23 22 - 32 mmol/L   Glucose, Bld 96 70 - 99 mg/dL   BUN 9 8 - 23 mg/dL   Creatinine, Ser 6.21 0.44 - 1.00 mg/dL   Calcium 9.2 8.9 - 30.8 mg/dL   Total Protein 7.7 6.5 - 8.1 g/dL   Albumin 3.9 3.5 - 5.0 g/dL   AST 13 (L) 15 - 41 U/L   ALT 8 0 -  44 U/L   Alkaline Phosphatase 53 38 - 126 U/L   Total Bilirubin 0.8 0.3 - 1.2 mg/dL   GFR, Estimated >65 >78 mL/min   Anion gap 8 5 - 15      Assessment & Plan:    Encounter Diagnoses  Name Primary?   Primary hypertension Yes   Hyperlipidemia, unspecified hyperlipidemia type    White coat syndrome with diagnosis of hypertension    Tobacco use disorder      -reviewed again most recent labs with pt -encouraged pt to schedule with [facility].  She says she will think about it and may call for appointment -pt to continue current medications -discussed that she cancelled her CT screening for lung cancer.  She says she was just anxious at the time and has the number and is planning to call to reschedule the appointment -pt to follow up 3 months.  She is to contact office sooner prn

## 2023-08-13 ENCOUNTER — Other Ambulatory Visit: Payer: Self-pay | Admitting: Physician Assistant

## 2023-08-14 ENCOUNTER — Other Ambulatory Visit: Payer: Self-pay | Admitting: Physician Assistant

## 2023-08-14 DIAGNOSIS — D649 Anemia, unspecified: Secondary | ICD-10-CM

## 2023-08-14 DIAGNOSIS — E785 Hyperlipidemia, unspecified: Secondary | ICD-10-CM

## 2023-08-14 DIAGNOSIS — R7989 Other specified abnormal findings of blood chemistry: Secondary | ICD-10-CM

## 2023-08-14 DIAGNOSIS — I10 Essential (primary) hypertension: Secondary | ICD-10-CM

## 2023-09-03 ENCOUNTER — Encounter: Payer: Self-pay | Admitting: Physician Assistant

## 2023-09-03 ENCOUNTER — Ambulatory Visit: Payer: Self-pay | Admitting: Physician Assistant

## 2023-09-03 VITALS — BP 140/80 | HR 86 | Temp 97.4°F | Ht 64.0 in | Wt 176.0 lb

## 2023-09-03 DIAGNOSIS — F17200 Nicotine dependence, unspecified, uncomplicated: Secondary | ICD-10-CM

## 2023-09-03 DIAGNOSIS — I10 Essential (primary) hypertension: Secondary | ICD-10-CM

## 2023-09-03 DIAGNOSIS — E785 Hyperlipidemia, unspecified: Secondary | ICD-10-CM

## 2023-09-03 DIAGNOSIS — D649 Anemia, unspecified: Secondary | ICD-10-CM

## 2023-09-03 NOTE — Progress Notes (Signed)
BP (!) 140/80   Pulse 86   Temp (!) 97.4 F (36.3 C)   Ht 5\' 4"  (1.626 m)   Wt 176 lb (79.8 kg)   SpO2 97%   BMI 30.21 kg/m    Subjective:    Patient ID: Whitney Bird, female    DOB: 1961-07-02, 62 y.o.   MRN: [id number]  HPI: Whitney Bird is a 62 y.o. female presenting on 09/03/2023 for Hypertension and Hyperlipidemia   HPI   Chief Complaint  Patient presents with   Hypertension   Hyperlipidemia    Pt is pleasant 62yoF in today for routine follow up.   She got approved for medicaid she got letter last month sometime.    She says she is feeling well today and has no complaints.      Relevant past medical, surgical, family and social history reviewed and updated as indicated. Interim medical history since our last visit reviewed. Allergies and medications reviewed and updated.   Current Outpatient Medications:    amLODipine (NORVASC) 10 MG tablet, TAKE 1 Tablet BY MOUTH ONCE DAILY, Disp: 90 tablet, Rfl: 1   atorvastatin (LIPITOR) 20 MG tablet, TAKE 1 Tablet BY MOUTH ONCE EVERY DAY, Disp: 90 tablet, Rfl: 1   losartan (COZAAR) 100 MG tablet, TAKE 1 Tablet BY MOUTH ONCE EVERY DAY, Disp: 90 tablet, Rfl: 1   metoprolol tartrate (LOPRESSOR) 50 MG tablet, TAKE 1 Tablet  BY MOUTH TWICE DAILY, Disp: 180 tablet, Rfl: 1    Review of Systems  Per HPI unless specifically indicated above     Objective:    BP (!) 140/80   Pulse 86   Temp (!) 97.4 F (36.3 C)   Ht 5\' 4"  (1.626 m)   Wt 176 lb (79.8 kg)   SpO2 97%   BMI 30.21 kg/m   Wt Readings from Last 3 Encounters:  09/03/23 176 lb (79.8 kg)  06/04/23 176 lb 8 oz (80.1 kg)  03/05/23 184 lb (83.5 kg)    Physical Exam Vitals reviewed.  Constitutional:      General: She is not in acute distress.    Appearance: She is well-developed. She is not toxic-appearing.  HENT:     Head: Normocephalic and atraumatic.  Cardiovascular:     Rate and Rhythm: Normal rate and regular rhythm.  Pulmonary:      Effort: Pulmonary effort is normal.     Breath sounds: Normal breath sounds.  Abdominal:     General: Bowel sounds are normal.     Palpations: Abdomen is soft. There is no mass.     Tenderness: There is no abdominal tenderness.  Musculoskeletal:     Cervical back: Neck supple.     Right lower leg: No edema.     Left lower leg: No edema.  Lymphadenopathy:     Cervical: No cervical adenopathy.  Skin:    General: Skin is warm and dry.  Neurological:     Mental Status: She is alert and oriented to person, place, and time.  Psychiatric:        Behavior: Behavior normal.            Assessment & Plan:    Encounter Diagnoses  Name Primary?   Primary hypertension Yes   Hyperlipidemia, unspecified hyperlipidemia type    Tobacco use disorder    Anemia, unspecified type      -discussed with pt that she will need to establish with new pcp as she now has [employer] -her labs  need to be updated (were ordered for today's appointment but they were not done) -discussed with pt that screening Mammogram is due in January -discussed with pt that she still needs CT for lung cancer screening.  She had appointment for this to be done in may but it did not get done -colon cancer screening is also due as her last FIT test was done September 2023 -pt has plenty of all her meds -she is given a list of providers in [place] who are accepting medicaid patients

## 2023-09-24 ENCOUNTER — Telehealth: Payer: Self-pay | Admitting: Family Medicine

## 2023-09-24 NOTE — Telephone Encounter (Signed)
scheduled

## 2023-09-24 NOTE — Telephone Encounter (Signed)
Needs new patient approval

## 2023-09-24 NOTE — Telephone Encounter (Signed)
That's fine

## 2023-10-28 ENCOUNTER — Ambulatory Visit (INDEPENDENT_AMBULATORY_CARE_PROVIDER_SITE_OTHER): Payer: Medicaid Other | Admitting: Family Medicine

## 2023-10-28 ENCOUNTER — Encounter: Payer: Self-pay | Admitting: Family Medicine

## 2023-10-28 VITALS — BP 140/82 | HR 78 | Ht 64.0 in | Wt 176.2 lb

## 2023-10-28 DIAGNOSIS — Z114 Encounter for screening for human immunodeficiency virus [HIV]: Secondary | ICD-10-CM

## 2023-10-28 DIAGNOSIS — R7301 Impaired fasting glucose: Secondary | ICD-10-CM

## 2023-10-28 DIAGNOSIS — B349 Viral infection, unspecified: Secondary | ICD-10-CM | POA: Diagnosis not present

## 2023-10-28 DIAGNOSIS — E7849 Other hyperlipidemia: Secondary | ICD-10-CM

## 2023-10-28 DIAGNOSIS — I10 Essential (primary) hypertension: Secondary | ICD-10-CM | POA: Diagnosis not present

## 2023-10-28 DIAGNOSIS — E038 Other specified hypothyroidism: Secondary | ICD-10-CM

## 2023-10-28 DIAGNOSIS — E559 Vitamin D deficiency, unspecified: Secondary | ICD-10-CM

## 2023-10-28 DIAGNOSIS — Z1159 Encounter for screening for other viral diseases: Secondary | ICD-10-CM

## 2023-10-28 DIAGNOSIS — Z1211 Encounter for screening for malignant neoplasm of colon: Secondary | ICD-10-CM

## 2023-10-28 MED ORDER — FLUTICASONE PROPIONATE 50 MCG/ACT NA SUSP
2.0000 | Freq: Every day | NASAL | 6 refills | Status: AC
Start: 2023-10-28 — End: ?

## 2023-10-28 MED ORDER — LEVOCETIRIZINE DIHYDROCHLORIDE 5 MG PO TABS
5.0000 mg | ORAL_TABLET | Freq: Every evening | ORAL | 1 refills | Status: AC
Start: 2023-10-28 — End: ?

## 2023-10-28 MED ORDER — ATORVASTATIN CALCIUM 20 MG PO TABS: 20.0000 mg | ORAL_TABLET | Freq: Every day | ORAL | 1 refills | Status: DC

## 2023-10-28 MED ORDER — AMLODIPINE BESYLATE 10 MG PO TABS: 10.0000 mg | ORAL_TABLET | Freq: Every day | ORAL | 1 refills | Status: DC

## 2023-10-28 MED ORDER — LOSARTAN POTASSIUM 100 MG PO TABS
100.0000 mg | ORAL_TABLET | Freq: Every day | ORAL | 1 refills | Status: AC
Start: 2023-10-28 — End: ?

## 2023-10-28 MED ORDER — METOPROLOL TARTRATE 50 MG PO TABS: ORAL_TABLET | ORAL | 1 refills | Status: DC

## 2023-10-28 NOTE — Patient Instructions (Addendum)
I appreciate the opportunity to provide care to you today!    Follow up:  1 month for BP  Labs: please stop by the lab today to get your blood drawn (CBC, CMP, TSH, Lipid profile, HgA1c, Vit D)  Screening: HIV and Hep C  A prescription for Flonase nasal spray has been sent to your pharmacy. Please start taking Xyzal 5 mg at bedtime to help with congestion and a runny nose. Be sure to pick up your refills at the pharmacy as needed. In addition to the medication, please adhere to the following nonpharmacological interventions for nasal congestion:  -Steam Inhalation: Inhaling steam from a bowl of hot water or during a hot shower can help loosen mucus and ease nasal congestion. Covering the head with a towel over the steam bowl may enhance the effects. -Saline Nasal Spray or Rinse: Using a saline spray or performing a nasal rinse with a neti pot can help clear nasal passages by flushing out mucus and irritants, reducing congestion. -Humidification: Using a humidifier in the bedroom or living areas can add moisture to the air, preventing nasal passages from becoming dry and helping to relieve congestion. -Warm Compress: Applying a warm compress to the forehead, nose, and sinuses can help alleviate sinus pressure and congestion, especially if there is facial discomfort or headache. -Hydration: Drinking plenty of fluids, such as water, herbal teas, and broths, can help thin mucus and reduce the severity of congestion. Staying well-hydrated helps promote healthy mucus flow. -Rest: Adequate rest and sleep allow the body to heal and may help improve immune function, which can aid in relieving nasal and head congestion. -Dietary Adjustments: Hot foods and drinks, such as spicy foods or warm soups, can temporarily clear nasal passages and relieve congestion.  Hypertension Management  Your current blood pressure is above the target goal of <140/90 mmHg. To address this, please continue taking metoprolol 50  mg twice daily, losartan 100 mg daily, amlodipine 10 mg daily.  Medication Instructions: Take your blood pressure medication at the same time each day. After taking your medication, check your blood pressure at least an hour later. If your first reading is >140/90 mmHg, wait at least 10 minutes and recheck your blood pressure. Diet and Lifestyle: Adhere to a low-sodium diet, limiting intake to less than 1500 mg daily, and increase your physical activity. Avoid over-the-counter NSAIDs such as ibuprofen and naproxen while on this medication. Hydration and Nutrition: Stay well-hydrated by drinking at least 64 ounces of water daily. Increase your servings of fruits and vegetables and avoid excessive sodium in your diet. Long-Term Considerations: Uncontrolled hypertension can increase the risk of cardiovascular diseases, including stroke, coronary artery disease, and heart failure.  Please report to the emergency department if your blood pressure exceeds 180/120 and is accompanied by symptoms such as headaches, chest pain, palpitations, blurred vision, or dizziness.  I recommend smoking cessation as a critical step for improving your overall health. Smoking significantly increases your risk for a range of serious health issues, including cancer, chronic obstructive pulmonary disease (COPD), high blood pressure, cataracts, and various digestive problems. Additionally, smoking can lead to oral health issues such as gum disease, mouth sores, tooth loss, and diminished taste and smell. It also irritates the throat and contributes to persistent coughing. Quitting smoking can greatly reduce these risks and improve your quality of life.     Attached with your AVS, you will find valuable resources for self-education. I highly recommend dedicating some time to thoroughly examine them.  Please continue to a heart-healthy diet and increase your physical activities. Try to exercise for at least five days a  week.    It was a pleasure to see you and I look forward to continuing to work together on your health and well-being. Please do not hesitate to call the office if you need care or have questions about your care.  In case of emergency, please visit the Emergency Department for urgent care, or contact our clinic at [phone] to schedule an appointment. We're here to help you!   Have a wonderful day and week. With Gratitude, Gilmore Laroche MSN, FNP-BC

## 2023-10-28 NOTE — Progress Notes (Signed)
New Patient Office Visit  Subjective:  Patient ID: Whitney Bird, female    DOB: [birth year]  Age: 62 y.o. MRN: [id number]  CC:  Chief Complaint  Patient presents with   Establish Care    New patient establishing care today. Reports head congestion, for the past few days.    HPI Whitney Bird is a 62 y.o. female with past medical history of primary hypertension hyperlipidemia presents for establishing care.   Hypertension:The patient reports experiencing white coat syndrome, where her blood pressure tends to be elevated in a clinical setting but is usually in the 130s systolic and 80s diastolic at home. She is currently asymptomatic and has been compliant with her prescribed treatment regimen, which includes amlodipine 10 mg daily, metoprolol 50 mg twice daily, and losartan 100 mg daily. It is important to continue monitoring her blood pressure at home to ensure her readings remain within target and report any significant changes. We may consider further adjustments in therapy if blood pressure readings consistently exceed the target.   Viral Illness:The patient reports the onset of nasal congestion about 3 days ago. She denies additional symptoms, such as headaches, sore throat, facial pain and pressure, cough, body aches, generalized malaise, nausea, vomiting, or diarrhea, which suggests that this may be a mild upper respiratory viral illness. I recommend supportive care, including hydration, rest, and over-the-counter medications like saline nasal spray or decongestants (if tolerated) to help alleviate her nasal congestion. It's important to monitor for any new symptoms, such as worsening congestion, fever, or productive cough, in case the illness progresses.    Past Medical History:  Diagnosis Date   Hypertension     Past Surgical History:  Procedure Laterality Date   KNEE ARTHROSCOPY Right     History reviewed. No pertinent family history.  Social History    Socioeconomic History   Marital status: Married    Spouse name: Not on file   Number of children: Not on file   Years of education: Not on file   Highest education level: Not on file  Occupational History   Not on file  Tobacco Use   Smoking status: Every Day    Current packs/day: 0.50    Average packs/day: 0.5 packs/day for 43.0 years (21.5 ttl pk-yrs)    Types: Cigarettes   Smokeless tobacco: Never   Tobacco comments:    1/2 pack a day.  Vaping Use   Vaping status: Never Used  Substance and Sexual Activity   Alcohol use: Never   Drug use: Not Currently    Types: Marijuana    Comment: none since 62yo   Sexual activity: Yes    Birth control/protection: Post-menopausal  Other Topics Concern   Not on file  Social History Narrative   Not on file   Social Determinants of Health   Financial Resource Strain: Not on file  Food Insecurity: No Food Insecurity (07/31/2021)   Hunger Vital Sign    Worried About Running Out of Food in the Last Year: Never true    Ran Out of Food in the Last Year: Never true  Transportation Needs: Unmet Transportation Needs (04/03/2022)   PRAPARE - Administrator, Civil Service (Medical): Yes    Lack of Transportation (Non-Medical): Yes  Physical Activity: Not on file  Stress: Not on file  Social Connections: Not on file  Intimate Partner Violence: Not on file    ROS Review of Systems  Constitutional:  Negative for chills and fever.  HENT:  Positive for congestion. Negative for postnasal drip, rhinorrhea, sinus pain and sore throat.   Eyes:  Negative for visual disturbance.  Respiratory:  Negative for cough, chest tightness and shortness of breath.   Neurological:  Negative for dizziness and headaches.    Objective:   Today's Vitals: BP (!) 140/82 (BP Location: Left Arm)   Pulse 78   Ht 5\' 4"  (1.626 m)   Wt 176 lb 3.2 oz (79.9 kg)   SpO2 98%   BMI 30.24 kg/m   Physical Exam HENT:     Head: Normocephalic.      Mouth/Throat:     Mouth: Mucous membranes are moist.  Cardiovascular:     Rate and Rhythm: Normal rate.     Heart sounds: Normal heart sounds.  Pulmonary:     Effort: Pulmonary effort is normal.     Breath sounds: Normal breath sounds.  Neurological:     Mental Status: She is alert.      Assessment & Plan:   Primary hypertension Assessment & Plan:  The patient presents with uncontrolled blood pressure in the clinic, likely due to white coat syndrome. I encouraged the patient to continue taking her prescribed medications: metoprolol 50 mg twice daily, losartan 100 mg daily, and amlodipine 10 mg daily.  Additionally, I recommended that she check her blood pressure once daily at home and bring the ambulatory readings to her next appointment in 4 weeks for further evaluation and monitoring.  I reviewed the long-term considerations of uncontrolled hypertension, including an increased risk of cardiovascular diseases such as stroke, coronary artery disease, and heart failure.  Finally, I emphasized the importance of monitoring for severe symptoms, advising the patient to report to the emergency department immediately if her blood pressure exceeds 180/120 and is accompanied by symptoms such as headaches, chest pain, palpitations, blurred vision, or dizziness.   Orders: -     Lipid panel -     CMP14+EGFR -     CBC with Differential/Platelet -     Losartan Potassium; Take 1 tablet (100 mg total) by mouth daily.  Dispense: 90 tablet; Refill: 1 -     Metoprolol Tartrate; TAKE 1 Tablet  BY MOUTH TWICE DAILY  Dispense: 180 tablet; Refill: 1 -     amLODIPine Besylate; Take 1 tablet (10 mg total) by mouth daily.  Dispense: 90 tablet; Refill: 1  Viral illness Assessment & Plan: The patient will be treated today with Flonase nasal spray to help relieve nasal congestion. I have also encouraged the patient to take Xyzal 5 mg at nighttime for added relief of congestion and to help with any potential  allergic components. The patient has been advised to continue taking over-the-counter Mucinex for cough and congestion, as needed.  Additionally, I recommended the use of a warm compress applied to the forehead, nose, and sinuses, which can help alleviate sinus pressure and congestion, especially if there is associated facial discomfort or headache.  Steam inhalation therapy and the use of a humidifier at bedtime were also suggested to help keep the nasal passages moist and reduce congestion. Increased hydration is encouraged to help thin mucus and promote drainage.  The patient verbalized understanding of the plan and has been encouraged to follow up if symptoms worsen or if new concerning symptoms develop.   Orders: -     Fluticasone Propionate; Place 2 sprays into both nostrils daily.  Dispense: 16 g; Refill: 6 -     Levocetirizine Dihydrochloride; Take 1 tablet (5  mg total) by mouth every evening.  Dispense: 60 tablet; Refill: 1  Colon cancer screening -     Cologuard  IFG (impaired fasting glucose) -     Hemoglobin A1c  Vitamin D deficiency -     VITAMIN D 25 Hydroxy (Vit-D Deficiency, Fractures)  Need for hepatitis C screening test -     Hepatitis C antibody  Encounter for screening for HIV -     HIV Antibody (routine testing w rflx)  TSH (thyroid-stimulating hormone deficiency) -     TSH + free T4  Other hyperlipidemia -     Lipid panel -     Atorvastatin Calcium; Take 1 tablet (20 mg total) by mouth daily.  Dispense: 90 tablet; Refill: 1   Note: This chart has been completed using Engineer, civil (consulting) software, and while attempts have been made to ensure accuracy, certain words and phrases may not be transcribed as intended.    Follow-up: Return in about 1 month (around 11/27/2023) for BP.   Gilmore Laroche, FNP

## 2023-10-28 NOTE — Assessment & Plan Note (Signed)
  The patient presents with uncontrolled blood pressure in the clinic, likely due to white coat syndrome. I encouraged the patient to continue taking her prescribed medications: metoprolol 50 mg twice daily, losartan 100 mg daily, and amlodipine 10 mg daily.  Additionally, I recommended that she check her blood pressure once daily at home and bring the ambulatory readings to her next appointment in 4 weeks for further evaluation and monitoring.  I reviewed the long-term considerations of uncontrolled hypertension, including an increased risk of cardiovascular diseases such as stroke, coronary artery disease, and heart failure.  Finally, I emphasized the importance of monitoring for severe symptoms, advising the patient to report to the emergency department immediately if her blood pressure exceeds 180/120 and is accompanied by symptoms such as headaches, chest pain, palpitations, blurred vision, or dizziness.

## 2023-10-28 NOTE — Assessment & Plan Note (Signed)
The patient will be treated today with Flonase nasal spray to help relieve nasal congestion. I have also encouraged the patient to take Xyzal 5 mg at nighttime for added relief of congestion and to help with any potential allergic components. The patient has been advised to continue taking over-the-counter Mucinex for cough and congestion, as needed.  Additionally, I recommended the use of a warm compress applied to the forehead, nose, and sinuses, which can help alleviate sinus pressure and congestion, especially if there is associated facial discomfort or headache.  Steam inhalation therapy and the use of a humidifier at bedtime were also suggested to help keep the nasal passages moist and reduce congestion. Increased hydration is encouraged to help thin mucus and promote drainage.  The patient verbalized understanding of the plan and has been encouraged to follow up if symptoms worsen or if new concerning symptoms develop.

## 2023-10-29 LAB — CMP14+EGFR
ALT: 6 [IU]/L (ref 0–32)
AST: 15 [IU]/L (ref 0–40)
Albumin: 4.2 g/dL (ref 3.9–4.9)
Alkaline Phosphatase: 71 [IU]/L (ref 44–121)
BUN/Creatinine Ratio: 10 — ABNORMAL LOW (ref 12–28)
BUN: 12 mg/dL (ref 8–27)
Bilirubin Total: 0.7 mg/dL (ref 0.0–1.2)
CO2: 23 mmol/L (ref 20–29)
Calcium: 9.9 mg/dL (ref 8.7–10.3)
Chloride: 102 mmol/L (ref 96–106)
Creatinine, Ser: 1.24 mg/dL — ABNORMAL HIGH (ref 0.57–1.00)
Globulin, Total: 3.4 g/dL (ref 1.5–4.5)
Glucose: 97 mg/dL (ref 70–99)
Potassium: 4.7 mmol/L (ref 3.5–5.2)
Sodium: 138 mmol/L (ref 134–144)
Total Protein: 7.6 g/dL (ref 6.0–8.5)
eGFR: 49 mL/min/{1.73_m2} — ABNORMAL LOW (ref >59)

## 2023-10-29 LAB — CBC WITH DIFFERENTIAL/PLATELET
Basophils Absolute: 0.1 10*3/uL (ref 0.0–0.2)
Basos: 1 %
EOS (ABSOLUTE): 0.1 10*3/uL (ref 0.0–0.4)
Eos: 1 %
Hematocrit: 36.4 % (ref 34.0–46.6)
Hemoglobin: 11.7 g/dL (ref 11.1–15.9)
Immature Grans (Abs): 0 10*3/uL (ref 0.0–0.1)
Immature Granulocytes: 0 %
Lymphocytes Absolute: 3.7 10*3/uL — ABNORMAL HIGH (ref 0.7–3.1)
Lymphs: 37 %
MCH: 28.7 pg (ref 26.6–33.0)
MCHC: 32.1 g/dL (ref 31.5–35.7)
MCV: 89 fL (ref 79–97)
Monocytes Absolute: 0.7 10*3/uL (ref 0.1–0.9)
Monocytes: 7 %
Neutrophils Absolute: 5.5 10*3/uL (ref 1.4–7.0)
Neutrophils: 54 %
Platelets: 432 10*3/uL (ref 150–450)
RBC: 4.08 x10E6/uL (ref 3.77–5.28)
RDW: 12.5 % (ref 11.7–15.4)
WBC: 10 10*3/uL (ref 3.4–10.8)

## 2023-10-29 LAB — HIV ANTIBODY (ROUTINE TESTING W REFLEX): HIV Screen 4th Generation wRfx: NONREACTIVE

## 2023-10-29 LAB — LIPID PANEL
Chol/HDL Ratio: 3.1 ratio (ref 0.0–4.4)
Cholesterol, Total: 144 mg/dL (ref 100–199)
HDL: 47 mg/dL (ref >39)
LDL Chol Calc (NIH): 83 mg/dL (ref 0–99)
Triglycerides: 70 mg/dL (ref 0–149)
VLDL Cholesterol Cal: 14 mg/dL (ref 5–40)

## 2023-10-29 LAB — HEPATITIS C ANTIBODY: Hep C Virus Ab: NONREACTIVE

## 2023-10-29 LAB — TSH+FREE T4
Free T4: 1.45 ng/dL (ref 0.82–1.77)
TSH: 0.753 u[IU]/mL (ref 0.450–4.500)

## 2023-10-29 LAB — HEMOGLOBIN A1C
Est. average glucose Bld gHb Est-mCnc: 108 mg/dL
Hgb A1c MFr Bld: 5.4 % (ref 4.8–5.6)

## 2023-10-29 LAB — VITAMIN D 25 HYDROXY (VIT D DEFICIENCY, FRACTURES): Vit D, 25-Hydroxy: 14.8 ng/mL — ABNORMAL LOW (ref 30.0–100.0)

## 2023-10-30 ENCOUNTER — Other Ambulatory Visit: Payer: Self-pay | Admitting: Family Medicine

## 2023-10-30 DIAGNOSIS — E559 Vitamin D deficiency, unspecified: Secondary | ICD-10-CM

## 2023-10-30 MED ORDER — VITAMIN D (ERGOCALCIFEROL) 1.25 MG (50000 UNIT) PO CAPS: 50000.0000 [IU] | ORAL_CAPSULE | ORAL | 1 refills | Status: DC

## 2023-10-30 NOTE — Progress Notes (Signed)
Please inform the patient that a weekly vitamin D supplement has been sent to her pharmacy to help increase her vitamin D levels, as her current level is low. I recommend increasing her fluid intake to at least 64 ounces daily to ensure adequate hydration. All other labs are stable. Wishing her a happy Thanksgiving!

## 2023-11-26 ENCOUNTER — Ambulatory Visit (INDEPENDENT_AMBULATORY_CARE_PROVIDER_SITE_OTHER): Payer: Medicaid Other | Admitting: Family Medicine

## 2023-11-26 ENCOUNTER — Encounter: Payer: Self-pay | Admitting: Family Medicine

## 2023-11-26 VITALS — BP 134/75 | HR 68 | Ht 64.0 in | Wt 176.0 lb

## 2023-11-26 DIAGNOSIS — I10 Essential (primary) hypertension: Secondary | ICD-10-CM

## 2023-11-26 DIAGNOSIS — R32 Unspecified urinary incontinence: Secondary | ICD-10-CM

## 2023-11-26 NOTE — Patient Instructions (Addendum)
I appreciate the opportunity to provide care to you today!    Follow up:  4 months  Labs: please stop by the lab today to get your blood drawn (CBC, CMP, TSH, Lipid profile, HgA1c, Vit D)   Attached with your AVS, you will find valuable resources for self-education. I highly recommend dedicating some time to thoroughly examine them.   Please continue to a heart-healthy diet and increase your physical activities. Try to exercise for 30mins at least five days a week.    It was a pleasure to see you and I look forward to continuing to work together on your health and well-being. Please do not hesitate to call the office if you need care or have questions about your care.  In case of emergency, please visit the Emergency Department for urgent care, or contact our clinic at [phone] to schedule an appointment. We're here to help you!   Have a wonderful day and week. With Gratitude, Kylan Veach MSN, FNP-BC  

## 2023-11-26 NOTE — Progress Notes (Addendum)
Established Patient Office Visit  Subjective:  Patient ID: Whitney Bird, female    DOB: Dec 15, 1960  Age: 62 y.o. MRN: [id number]  CC:  Chief Complaint  Patient presents with   Care Management    1 month f/u for htn.    HPI Whitney Bird is a 62 y.o. female presents for blood pressure f/u. For the details of today's visit, please refer to the assessment and plan.     Past Medical History:  Diagnosis Date   Hypertension     Past Surgical History:  Procedure Laterality Date   KNEE ARTHROSCOPY Right     History reviewed. No pertinent family history.  Social History   Socioeconomic History   Marital status: Married    Spouse name: Not on file   Number of children: Not on file   Years of education: Not on file   Highest education level: Not on file  Occupational History   Not on file  Tobacco Use   Smoking status: Every Day    Current packs/day: 0.50    Average packs/day: 0.5 packs/day for 43.0 years (21.5 ttl pk-yrs)    Types: Cigarettes   Smokeless tobacco: Never   Tobacco comments:    1/2 pack a day.  Vaping Use   Vaping status: Never Used  Substance and Sexual Activity   Alcohol use: Never   Drug use: Not Currently    Types: Marijuana    Comment: none since 62yo   Sexual activity: Yes    Birth control/protection: Post-menopausal  Other Topics Concern   Not on file  Social History Narrative   Not on file   Social Drivers of Health   Financial Resource Strain: Not on file  Food Insecurity: No Food Insecurity (07/31/2021)   Hunger Vital Sign    Worried About Running Out of Food in the Last Year: Never true    Ran Out of Food in the Last Year: Never true  Transportation Needs: Unmet Transportation Needs (04/03/2022)   PRAPARE - Administrator, Civil Service (Medical): Yes    Lack of Transportation (Non-Medical): Yes  Physical Activity: Not on file  Stress: Not on file  Social Connections: Not on file  Intimate Partner  Violence: Not on file    Outpatient Medications Prior to Visit  Medication Sig Dispense Refill   amLODipine (NORVASC) 10 MG tablet Take 1 tablet (10 mg total) by mouth daily. 90 tablet 1   atorvastatin (LIPITOR) 20 MG tablet Take 1 tablet (20 mg total) by mouth daily. 90 tablet 1   fluticasone (FLONASE) 50 MCG/ACT nasal spray Place 2 sprays into both nostrils daily. 16 g 6   levocetirizine (XYZAL) 5 MG tablet Take 1 tablet (5 mg total) by mouth every evening. 60 tablet 1   losartan (COZAAR) 100 MG tablet Take 1 tablet (100 mg total) by mouth daily. 90 tablet 1   metoprolol tartrate (LOPRESSOR) 50 MG tablet TAKE 1 Tablet  BY MOUTH TWICE DAILY 180 tablet 1   Vitamin D, Ergocalciferol, (DRISDOL) 1.25 MG (50000 UNIT) CAPS capsule Take 1 capsule (50,000 Units total) by mouth every 7 (seven) days. 20 capsule 1   No facility-administered medications prior to visit.    No Known Allergies  ROS Review of Systems  Constitutional:  Negative for chills and fever.  Eyes:  Negative for visual disturbance.  Respiratory:  Negative for chest tightness and shortness of breath.   Neurological:  Negative for dizziness and headaches.  Objective:    Physical Exam HENT:     Head: Normocephalic.     Mouth/Throat:     Mouth: Mucous membranes are moist.  Cardiovascular:     Rate and Rhythm: Normal rate.     Heart sounds: Normal heart sounds.  Pulmonary:     Effort: Pulmonary effort is normal.     Breath sounds: Normal breath sounds.  Neurological:     Mental Status: She is alert.     BP 134/75   Pulse 68   Ht 5\' 4"  (1.626 m)   Wt 176 lb (79.8 kg)   SpO2 95%   BMI 30.21 kg/m  Wt Readings from Last 3 Encounters:  11/26/23 176 lb (79.8 kg)  10/28/23 176 lb 3.2 oz (79.9 kg)  09/03/23 176 lb (79.8 kg)    Lab Results  Component Value Date   TSH 0.753 10/28/2023   Lab Results  Component Value Date   WBC 10.0 10/28/2023   HGB 11.7 10/28/2023   HCT 36.4 10/28/2023   MCV 89  10/28/2023   PLT 432 10/28/2023   Lab Results  Component Value Date   NA 138 10/28/2023   K 4.7 10/28/2023   CO2 23 10/28/2023   GLUCOSE 97 10/28/2023   BUN 12 10/28/2023   CREATININE 1.24 (H) 10/28/2023   BILITOT 0.7 10/28/2023   ALKPHOS 71 10/28/2023   AST 15 10/28/2023   ALT 6 10/28/2023   PROT 7.6 10/28/2023   ALBUMIN 4.2 10/28/2023   CALCIUM 9.9 10/28/2023   ANIONGAP 8 04/14/2023   EGFR 49 (L) 10/28/2023   Lab Results  Component Value Date   CHOL 144 10/28/2023   Lab Results  Component Value Date   HDL 47 10/28/2023   Lab Results  Component Value Date   LDLCALC 83 10/28/2023   Lab Results  Component Value Date   TRIG 70 10/28/2023   Lab Results  Component Value Date   CHOLHDL 3.1 10/28/2023   Lab Results  Component Value Date   HGBA1C 5.4 10/28/2023      Assessment & Plan:  Primary hypertension Assessment & Plan: The patient's blood pressure is controlled in the clinic, with ambulatory readings in the 120s systolic and 60s diastolic. The patient is asymptomatic today. I encouraged the patient to continue with a low-sodium diet and increase physical activity to help maintain optimal blood pressure levels. The patient is also encouraged to continue taking their prescribed medications, which include amlodipine 50 mg twice daily, losartan 100 mg daily, and amlodipine 10 mg daily.  BP Readings from Last 3 Encounters:  11/26/23 134/75  10/28/23 (!) 140/82  09/03/23 (!) 140/80      Incontinence in female Assessment & Plan: The patient complains of urinary incontinence, which has been ongoing for a few years. She is receiving urinary incontinence supplies through [facility]. No symptoms of UTI were reported today. She was encouraged to limit fluid intake before bedtime and avoid bladder irritants such as caffeinated drinks, artificial sweeteners, and acidic foods and drinks. The patient expresses understanding.    Note: This chart has been completed  using Engineer, civil (consulting) software, and while attempts have been made to ensure accuracy, certain words and phrases may not be transcribed as intended.    Follow-up: Return in about 4 months (around 03/26/2024).   Gilmore Laroche, FNP

## 2023-11-26 NOTE — Assessment & Plan Note (Signed)
The patient's blood pressure is controlled in the clinic, with ambulatory readings in the 120s systolic and 60s diastolic. The patient is asymptomatic today. I encouraged the patient to continue with a low-sodium diet and increase physical activity to help maintain optimal blood pressure levels. The patient is also encouraged to continue taking their prescribed medications, which include amlodipine 50 mg twice daily, losartan 100 mg daily, and amlodipine 10 mg daily.  BP Readings from Last 3 Encounters:  11/26/23 134/75  10/28/23 (!) 140/82  09/03/23 (!) 140/80

## 2024-01-06 ENCOUNTER — Telehealth: Payer: Self-pay | Admitting: Family Medicine

## 2024-01-06 NOTE — Telephone Encounter (Signed)
Copied from CRM [id number]. Topic: Medical Record Request - Other >> Jan 06, 2024 10:41 AM Shelah Lewandowsky wrote: Reason for CRM: Susy Frizzle with [facility] calling, they need an addendum to the patient chart to include incontinence, please call with any questions [phone]

## 2024-01-08 DIAGNOSIS — R32 Unspecified urinary incontinence: Secondary | ICD-10-CM | POA: Insufficient documentation

## 2024-01-08 NOTE — Assessment & Plan Note (Signed)
The patient complains of urinary incontinence, which has been ongoing for a few years. She is receiving urinary incontinence supplies through [facility]. No symptoms of UTI were reported today. She was encouraged to limit fluid intake before bedtime and avoid bladder irritants such as caffeinated drinks, artificial sweeteners, and acidic foods and drinks. The patient expresses understanding.

## 2024-01-08 NOTE — Telephone Encounter (Signed)
completed

## 2024-01-13 NOTE — Telephone Encounter (Signed)
Addended notes faxed to [facility] at [phone]

## 2024-01-19 ENCOUNTER — Telehealth: Payer: Self-pay | Admitting: Family Medicine

## 2024-01-19 ENCOUNTER — Other Ambulatory Visit: Payer: Self-pay | Admitting: Family Medicine

## 2024-01-19 NOTE — Telephone Encounter (Signed)
 Amended notes sent to [facility] 01/19/24

## 2024-01-20 ENCOUNTER — Telehealth: Payer: Self-pay | Admitting: Family Medicine

## 2024-01-20 NOTE — Telephone Encounter (Signed)
Copied from CRM [id number]. Topic: General - Phone/Fax/Address >> Jan 20, 2024  8:56 AM Higinio Roger wrote: Amy from [facility] would like the addendum note for incontinence supplies refax, due to them not receiving it. Fax #: [phone]

## 2024-01-20 NOTE — Telephone Encounter (Signed)
Faxed recent ov note to [facility]

## 2024-01-20 NOTE — Telephone Encounter (Signed)
Notes resent to fax number provided 01/20/24

## 2024-01-30 ENCOUNTER — Telehealth: Payer: Self-pay | Admitting: Family Medicine

## 2024-01-30 NOTE — Telephone Encounter (Signed)
 Copied from CRM [id number]. Topic: General - Other >> Jan 30, 2024 12:00 PM Gery Pray wrote: Reason for CRM: Dorene Grebe from [facility] called to check if the office has received an incontinence order from them that was sent on 02/14 by fax.  Phone: [phone] Fax: [phone]

## 2024-03-23 ENCOUNTER — Ambulatory Visit: Payer: No Typology Code available for payment source | Admitting: Family Medicine

## 2024-04-29 ENCOUNTER — Encounter: Payer: Self-pay | Admitting: Family Medicine

## 2024-04-29 ENCOUNTER — Ambulatory Visit (INDEPENDENT_AMBULATORY_CARE_PROVIDER_SITE_OTHER): Admitting: Family Medicine

## 2024-04-29 VITALS — BP 138/70 | HR 70 | Resp 15 | Ht 64.0 in | Wt 174.0 lb

## 2024-04-29 DIAGNOSIS — E038 Other specified hypothyroidism: Secondary | ICD-10-CM

## 2024-04-29 DIAGNOSIS — E7849 Other hyperlipidemia: Secondary | ICD-10-CM | POA: Diagnosis not present

## 2024-04-29 DIAGNOSIS — E559 Vitamin D deficiency, unspecified: Secondary | ICD-10-CM

## 2024-04-29 DIAGNOSIS — Z1231 Encounter for screening mammogram for malignant neoplasm of breast: Secondary | ICD-10-CM

## 2024-04-29 DIAGNOSIS — I10 Essential (primary) hypertension: Secondary | ICD-10-CM | POA: Diagnosis not present

## 2024-04-29 DIAGNOSIS — R7301 Impaired fasting glucose: Secondary | ICD-10-CM | POA: Diagnosis not present

## 2024-04-29 NOTE — Progress Notes (Signed)
 Established Patient Office Visit  Subjective:  Patient ID: Whitney Bird, female    DOB: Jun 16, 1961  Age: 63 y.o. MRN: [id number]  CC:  Chief Complaint  Patient presents with   Hypertension    Follow up visit     HPI Whitney Bird is a 63 y.o. female with past medical history of primary hypertension, hyperlipidemia presents for f/u of  chronic medical conditions.  For the details of today's visit, please refer to the assessment and plan.    Past Medical History:  Diagnosis Date   Hypertension     Past Surgical History:  Procedure Laterality Date   KNEE ARTHROSCOPY Right     No family history on file.  Social History   Socioeconomic History   Marital status: Married    Spouse name: Not on file   Number of children: Not on file   Years of education: Not on file   Highest education level: Not on file  Occupational History   Not on file  Tobacco Use   Smoking status: Every Day    Current packs/day: 0.50    Average packs/day: 0.5 packs/day for 43.0 years (21.5 ttl pk-yrs)    Types: Cigarettes   Smokeless tobacco: Never   Tobacco comments:    1/2 pack a day.  Vaping Use   Vaping status: Never Used  Substance and Sexual Activity   Alcohol use: Never   Drug use: Not Currently    Types: Marijuana    Comment: none since 63yo   Sexual activity: Yes    Birth control/protection: Post-menopausal  Other Topics Concern   Not on file  Social History Narrative   Not on file   Social Drivers of Health   Financial Resource Strain: Not on file  Food Insecurity: No Food Insecurity (07/31/2021)   Hunger Vital Sign    Worried About Running Out of Food in the Last Year: Never true    Ran Out of Food in the Last Year: Never true  Transportation Needs: Unmet Transportation Needs (04/03/2022)   PRAPARE - Administrator, Civil Service (Medical): Yes    Lack of Transportation (Non-Medical): Yes  Physical Activity: Not on file  Stress: Not on file   Social Connections: Not on file  Intimate Partner Violence: Not on file    Outpatient Medications Prior to Visit  Medication Sig Dispense Refill   amLODipine  (NORVASC ) 10 MG tablet Take 1 tablet (10 mg total) by mouth daily. 90 tablet 1   atorvastatin  (LIPITOR) 20 MG tablet Take 1 tablet (20 mg total) by mouth daily. 90 tablet 1   fluticasone  (FLONASE ) 50 MCG/ACT nasal spray Place 2 sprays into both nostrils daily. 16 g 6   levocetirizine (XYZAL ) 5 MG tablet Take 1 tablet (5 mg total) by mouth every evening. 60 tablet 1   losartan  (COZAAR ) 100 MG tablet Take 1 tablet (100 mg total) by mouth daily. 90 tablet 1   metoprolol  tartrate (LOPRESSOR ) 50 MG tablet TAKE 1 Tablet  BY MOUTH TWICE DAILY 180 tablet 1   Vitamin D , Ergocalciferol , (DRISDOL ) 1.25 MG (50000 UNIT) CAPS capsule Take 1 capsule (50,000 Units total) by mouth every 7 (seven) days. 20 capsule 1   No facility-administered medications prior to visit.    No Known Allergies  ROS Review of Systems  Constitutional:  Negative for chills and fever.  Eyes:  Negative for visual disturbance.  Respiratory:  Negative for chest tightness and shortness of breath.   Neurological:  Negative for dizziness and headaches.      Objective:     Physical Exam HENT:     Head: Normocephalic.     Mouth/Throat:     Mouth: Mucous membranes are moist.  Cardiovascular:     Rate and Rhythm: Normal rate.     Heart sounds: Normal heart sounds.  Pulmonary:     Effort: Pulmonary effort is normal.     Breath sounds: Normal breath sounds.  Neurological:     Mental Status: She is alert.     BP 138/70   Pulse 70   Resp 15   Ht 5\' 4"  (1.626 m)   Wt 174 lb (78.9 kg)   SpO2 97%   BMI 29.87 kg/m  Wt Readings from Last 3 Encounters:  04/29/24 174 lb (78.9 kg)  11/26/23 176 lb (79.8 kg)  10/28/23 176 lb 3.2 oz (79.9 kg)    Lab Results  Component Value Date   TSH 0.753 10/28/2023   Lab Results  Component Value Date   WBC 10.0  10/28/2023   HGB 11.7 10/28/2023   HCT 36.4 10/28/2023   MCV 89 10/28/2023   PLT 432 10/28/2023   Lab Results  Component Value Date   NA 138 10/28/2023   K 4.7 10/28/2023   CO2 23 10/28/2023   GLUCOSE 97 10/28/2023   BUN 12 10/28/2023   CREATININE 1.24 (H) 10/28/2023   BILITOT 0.7 10/28/2023   ALKPHOS 71 10/28/2023   AST 15 10/28/2023   ALT 6 10/28/2023   PROT 7.6 10/28/2023   ALBUMIN 4.2 10/28/2023   CALCIUM  9.9 10/28/2023   ANIONGAP 8 04/14/2023   EGFR 49 (L) 10/28/2023   Lab Results  Component Value Date   CHOL 144 10/28/2023   Lab Results  Component Value Date   HDL 47 10/28/2023   Lab Results  Component Value Date   LDLCALC 83 10/28/2023   Lab Results  Component Value Date   TRIG 70 10/28/2023   Lab Results  Component Value Date   CHOLHDL 3.1 10/28/2023   Lab Results  Component Value Date   HGBA1C 5.4 10/28/2023      Assessment & Plan:  Primary hypertension Assessment & Plan: The patient's blood pressure is controlled in the clinic, with ambulatory readings in the 120s systolic and 60s diastolic. The patient is asymptomatic today. I encouraged the patient to continue with a low-sodium diet and increase physical activity to help maintain optimal blood pressure levels. The patient is also encouraged to continue taking their prescribed medications, which include amlodipine  50 mg twice daily, losartan  100 mg daily, and amlodipine  10 mg daily.  BP Readings from Last 3 Encounters:  04/29/24 138/70  11/26/23 134/75  10/28/23 (!) 140/82      Other hyperlipidemia Assessment & Plan: The patient was encouraged to continue taking atorvastatin  20 mg daily for cholesterol management. Lifestyle modifications were also discussed, including avoiding simple carbohydrates such as cakes, sweet desserts, ice cream, soda (diet or regular), sweet tea, candies, chips, cookies, store-bought juices, excessive alcohol (more than 1-2 drinks per day), lemonade, artificial  sweeteners, donuts, coffee creamers, and sugar-free products. Additionally, the patient was advised to reduce the consumption of greasy, fatty foods and increase physical activity to support cardiovascular health. The patient verbalized understanding and is aware of the plan of care.   Orders: -     Lipid panel -     CMP14+EGFR -     CBC with Differential/Platelet  IFG (impaired fasting glucose) -  Hemoglobin A1c  Vitamin D  deficiency -     VITAMIN D  25 Hydroxy (Vit-D Deficiency, Fractures)  TSH (thyroid -stimulating hormone deficiency) -     TSH + free T4  Breast cancer screening by mammogram -     3D Screening Mammogram, Left and Right  Note: This chart has been completed using Engineer, civil (consulting) software, and while attempts have been made to ensure accuracy, certain words and phrases may not be transcribed as intended.    Follow-up: Return in about 4 months (around 08/30/2024).   Evamae Rowen, FNP

## 2024-04-29 NOTE — Patient Instructions (Addendum)
 I appreciate the opportunity to provide care to you today!    Follow up:  4 months  Labs: please stop by the lab during the week to get your blood drawn (CBC, CMP, TSH, Lipid profile, HgA1c, Vit D)  Schedule mammogram   For a Healthier YOU, I Recommend: Reducing your intake of sugar, sodium, carbohydrates, and saturated fats. Increasing your fiber intake by incorporating more whole grains, fruits, and vegetables into your meals. Setting healthy goals with a focus on lowering your consumption of carbs, sugar, and unhealthy fats. Adding variety to your diet by including a wide range of fruits and vegetables. Cutting back on soda and limiting processed foods as much as possible. Staying active: In addition to taking your weight loss medication, aim for at least 150 minutes of moderate-intensity physical activity each week for optimal results.     Please continue to a heart-healthy diet and increase your physical activities. Try to exercise for at least five days a week.    It was a pleasure to see you and I look forward to continuing to work together on your health and well-being. Please do not hesitate to call the office if you need care or have questions about your care.  In case of emergency, please visit the Emergency Department for urgent care, or contact our clinic at [phone] to schedule an appointment. We're here to help you!   Have a wonderful day and week. With Gratitude, Ellis Koffler MSN, FNP-BC

## 2024-04-30 NOTE — Assessment & Plan Note (Signed)
 The patient's blood pressure is controlled in the clinic, with ambulatory readings in the 120s systolic and 60s diastolic. The patient is asymptomatic today. I encouraged the patient to continue with a low-sodium diet and increase physical activity to help maintain optimal blood pressure levels. The patient is also encouraged to continue taking their prescribed medications, which include amlodipine  50 mg twice daily, losartan  100 mg daily, and amlodipine  10 mg daily.  BP Readings from Last 3 Encounters:  04/29/24 138/70  11/26/23 134/75  10/28/23 (!) 140/82

## 2024-04-30 NOTE — Assessment & Plan Note (Signed)
 The patient was encouraged to continue taking atorvastatin 20 mg daily for cholesterol management. Lifestyle modifications were also discussed, including avoiding simple carbohydrates such as cakes, sweet desserts, ice cream, soda (diet or regular), sweet tea, candies, chips, cookies, store-bought juices, excessive alcohol (more than 1-2 drinks per day), lemonade, artificial sweeteners, donuts, coffee creamers, and sugar-free products. Additionally, the patient was advised to reduce the consumption of greasy, fatty foods and increase physical activity to support cardiovascular health. The patient verbalized understanding and is aware of the plan of care.

## 2024-05-10 ENCOUNTER — Encounter (HOSPITAL_COMMUNITY): Payer: Self-pay

## 2024-05-10 ENCOUNTER — Ambulatory Visit (HOSPITAL_COMMUNITY)
Admission: RE | Admit: 2024-05-10 | Discharge: 2024-05-10 | Disposition: A | Discharge status: Home / Self Care | Source: Ambulatory Visit | Attending: Family Medicine | Admitting: Family Medicine

## 2024-05-10 DIAGNOSIS — Z1231 Encounter for screening mammogram for malignant neoplasm of breast: Secondary | ICD-10-CM | POA: Insufficient documentation

## 2024-05-11 LAB — CBC WITH DIFFERENTIAL/PLATELET
Basophils Absolute: 0 10*3/uL (ref 0.0–0.2)
Basos: 0 %
EOS (ABSOLUTE): 0 10*3/uL (ref 0.0–0.4)
Eos: 0 %
Hematocrit: 36.7 % (ref 34.0–46.6)
Hemoglobin: 11.6 g/dL (ref 11.1–15.9)
Immature Grans (Abs): 0 10*3/uL (ref 0.0–0.1)
Immature Granulocytes: 0 %
Lymphocytes Absolute: 5.2 10*3/uL — ABNORMAL HIGH (ref 0.7–3.1)
Lymphs: 52 %
MCH: 28.6 pg (ref 26.6–33.0)
MCHC: 31.6 g/dL (ref 31.5–35.7)
MCV: 90 fL (ref 79–97)
Monocytes Absolute: 0.7 10*3/uL (ref 0.1–0.9)
Monocytes: 7 %
Neutrophils Absolute: 4.1 10*3/uL (ref 1.4–7.0)
Neutrophils: 41 %
Platelets: 406 10*3/uL (ref 150–450)
RBC: 4.06 x10E6/uL (ref 3.77–5.28)
RDW: 13.1 % (ref 11.7–15.4)
WBC: 10.1 10*3/uL (ref 3.4–10.8)

## 2024-05-11 LAB — LIPID PANEL
Chol/HDL Ratio: 3.5 ratio (ref 0.0–4.4)
Cholesterol, Total: 163 mg/dL (ref 100–199)
HDL: 47 mg/dL (ref >39)
LDL Chol Calc (NIH): 103 mg/dL — ABNORMAL HIGH (ref 0–99)
Triglycerides: 68 mg/dL (ref 0–149)
VLDL Cholesterol Cal: 13 mg/dL (ref 5–40)

## 2024-05-11 LAB — CMP14+EGFR
ALT: 7 IU/L (ref 0–32)
AST: 16 IU/L (ref 0–40)
Albumin: 4.4 g/dL (ref 3.9–4.9)
Alkaline Phosphatase: 58 IU/L (ref 44–121)
BUN/Creatinine Ratio: 16 (ref 12–28)
BUN: 22 mg/dL (ref 8–27)
Bilirubin Total: 0.5 mg/dL (ref 0.0–1.2)
CO2: 19 mmol/L — ABNORMAL LOW (ref 20–29)
Calcium: 10.1 mg/dL (ref 8.7–10.3)
Chloride: 102 mmol/L (ref 96–106)
Creatinine, Ser: 1.36 mg/dL — ABNORMAL HIGH (ref 0.57–1.00)
Globulin, Total: 3.1 g/dL (ref 1.5–4.5)
Glucose: 91 mg/dL (ref 70–99)
Potassium: 4.8 mmol/L (ref 3.5–5.2)
Sodium: 136 mmol/L (ref 134–144)
Total Protein: 7.5 g/dL (ref 6.0–8.5)
eGFR: 43 mL/min/{1.73_m2} — ABNORMAL LOW (ref >59)

## 2024-05-11 LAB — HEMOGLOBIN A1C
Est. average glucose Bld gHb Est-mCnc: 111 mg/dL
Hgb A1c MFr Bld: 5.5 % (ref 4.8–5.6)

## 2024-05-11 LAB — TSH+FREE T4
Free T4: 1.26 ng/dL (ref 0.82–1.77)
TSH: 0.816 u[IU]/mL (ref 0.450–4.500)

## 2024-05-11 LAB — VITAMIN D 25 HYDROXY (VIT D DEFICIENCY, FRACTURES): Vit D, 25-Hydroxy: 59.8 ng/mL (ref 30.0–100.0)

## 2024-05-26 ENCOUNTER — Other Ambulatory Visit: Payer: Self-pay | Admitting: Family Medicine

## 2024-05-26 DIAGNOSIS — I10 Essential (primary) hypertension: Secondary | ICD-10-CM

## 2024-05-26 DIAGNOSIS — E7849 Other hyperlipidemia: Secondary | ICD-10-CM

## 2024-06-01 ENCOUNTER — Ambulatory Visit: Payer: Self-pay | Admitting: Family Medicine

## 2024-08-30 ENCOUNTER — Ambulatory Visit: Admitting: Family Medicine

## 2024-11-13 ENCOUNTER — Other Ambulatory Visit: Payer: Self-pay | Admitting: Family Medicine

## 2024-11-13 DIAGNOSIS — E559 Vitamin D deficiency, unspecified: Secondary | ICD-10-CM

## 2024-11-13 DIAGNOSIS — I10 Essential (primary) hypertension: Secondary | ICD-10-CM

## 2024-11-13 DIAGNOSIS — E7849 Other hyperlipidemia: Secondary | ICD-10-CM

## 2024-12-24 ENCOUNTER — Telehealth: Admitting: Family Medicine

## 2024-12-24 DIAGNOSIS — E569 Vitamin deficiency, unspecified: Secondary | ICD-10-CM | POA: Diagnosis not present

## 2024-12-24 DIAGNOSIS — E039 Hypothyroidism, unspecified: Secondary | ICD-10-CM | POA: Diagnosis not present

## 2024-12-24 DIAGNOSIS — R7301 Impaired fasting glucose: Secondary | ICD-10-CM | POA: Diagnosis not present

## 2024-12-24 DIAGNOSIS — E782 Mixed hyperlipidemia: Secondary | ICD-10-CM | POA: Diagnosis not present

## 2024-12-24 NOTE — Progress Notes (Signed)
" ° °  Virtual Visit via Video Note  I connected with Whitney Bird on 12/24/24 at 11:00 AM EST by a video enabled telemedicine application and verified that I am speaking with the correct person using two identifiers.  Patient Location: Home Provider Location: Home Office  I discussed the limitations, risks, security, and privacy concerns of performing an evaluation and management service by video and the availability of in person appointments. I also discussed with the patient that there may be a patient responsible charge related to this service. The patient expressed understanding and agreed to proceed.  Subjective: PCP: Edman Meade PEDLAR, FNP  No chief complaint on file.  HPI The patient presents today for management of chronic conditions and reports no complaints or concerns at this time. She reports adherence to her treatment regimen with no side effects or adverse effects from her medications   ROS: Per HPI Current Medications[1]  Observations/Objective: There were no vitals filed for this visit. Physical Exam Patient is well-developed, well-nourished in no acute distress.  Resting comfortably at home.  Head is normocephalic, atraumatic.  No labored breathing.  Speech is clear and coherent with logical content.  Patient is alert and oriented at baseline.   Assessment and Plan: IFG (impaired fasting glucose) -     Hemoglobin A1c  Vitamin deficiency -     VITAMIN D  25 Hydroxy (Vit-D Deficiency, Fractures)  TSH (thyroid -stimulating hormone deficiency) -     TSH + free T4  Mixed hyperlipidemia -     Lipid panel -     CMP14+EGFR -     CBC with Differential/Platelet   Encouraged the patient to continue her treatment regimen as prescribed. A heart-healthy diet was encouraged, along with increased physical activity as tolerated.  Follow Up Instructions: Return in about 5 months (around 05/24/2025).   I discussed the assessment and treatment plan with the patient.  The patient was provided an opportunity to ask questions, and all were answered. The patient agreed with the plan and demonstrated an understanding of the instructions.   The patient was advised to call back or seek an in-person evaluation if the symptoms worsen or if the condition fails to improve as anticipated.  The above assessment and management plan was discussed with the patient. The patient verbalized understanding of and has agreed to the management plan.   Meade PEDLAR Edman, FNP     [1]  Current Outpatient Medications:    amLODipine  (NORVASC ) 10 MG tablet, TAKE 1 TABLET(10 MG) BY MOUTH DAILY, Disp: 90 tablet, Rfl: 1   atorvastatin  (LIPITOR) 20 MG tablet, TAKE 1 TABLET(20 MG) BY MOUTH DAILY, Disp: 90 tablet, Rfl: 1   fluticasone  (FLONASE ) 50 MCG/ACT nasal spray, Place 2 sprays into both nostrils daily., Disp: 16 g, Rfl: 6   levocetirizine (XYZAL ) 5 MG tablet, Take 1 tablet (5 mg total) by mouth every evening., Disp: 60 tablet, Rfl: 1   losartan  (COZAAR ) 100 MG tablet, TAKE 1 TABLET(100 MG) BY MOUTH DAILY, Disp: 90 tablet, Rfl: 1   metoprolol  tartrate (LOPRESSOR ) 50 MG tablet, TAKE 1 TABLET BY MOUTH TWICE DAILY, Disp: 180 tablet, Rfl: 1   Vitamin D , Ergocalciferol , (DRISDOL ) 1.25 MG (50000 UNIT) CAPS capsule, TAKE ONE CAPSULE BY MOUTH EVERY 7 DAYS AS DIRECTED, Disp: 20 capsule, Rfl: 1  "
